# Patient Record
Sex: Female | Born: 1935 | Race: White | Hispanic: No | Smoking: Never smoker
Health system: Southern US, Community
[De-identification: ages and names within clinical notes are randomized; demographics above are authoritative.]

## PROBLEM LIST (undated history)

## (undated) DIAGNOSIS — I10 Essential (primary) hypertension: Secondary | ICD-10-CM

## (undated) DIAGNOSIS — G459 Transient cerebral ischemic attack, unspecified: Secondary | ICD-10-CM

## (undated) DIAGNOSIS — H409 Unspecified glaucoma: Secondary | ICD-10-CM

## (undated) HISTORY — PX: OTHER SURGICAL HISTORY: SHX169

---

## 2001-12-02 ENCOUNTER — Ambulatory Visit (HOSPITAL_COMMUNITY): Admission: RE | Admit: 2001-12-02 | Discharge: 2001-12-02 | Payer: Self-pay | Admitting: Otolaryngology

## 2001-12-02 ENCOUNTER — Encounter: Payer: Self-pay | Admitting: Otolaryngology

## 2007-07-22 ENCOUNTER — Ambulatory Visit (HOSPITAL_COMMUNITY): Admission: RE | Admit: 2007-07-22 | Discharge: 2007-07-22 | Payer: Self-pay | Admitting: Obstetrics and Gynecology

## 2010-06-02 ENCOUNTER — Other Ambulatory Visit (HOSPITAL_COMMUNITY): Payer: Self-pay | Admitting: Pulmonary Disease

## 2010-06-07 ENCOUNTER — Ambulatory Visit (HOSPITAL_COMMUNITY)
Admission: RE | Admit: 2010-06-07 | Discharge: 2010-06-07 | Disposition: A | Discharge status: Home / Self Care | Payer: Medicare Other | Source: Ambulatory Visit | Attending: Pulmonary Disease | Admitting: Pulmonary Disease

## 2010-06-07 DIAGNOSIS — M818 Other osteoporosis without current pathological fracture: Secondary | ICD-10-CM | POA: Insufficient documentation

## 2010-06-07 DIAGNOSIS — Z780 Asymptomatic menopausal state: Secondary | ICD-10-CM | POA: Insufficient documentation

## 2012-06-06 ENCOUNTER — Ambulatory Visit (INDEPENDENT_AMBULATORY_CARE_PROVIDER_SITE_OTHER): Payer: Medicare Other | Admitting: Otolaryngology

## 2012-06-06 DIAGNOSIS — H903 Sensorineural hearing loss, bilateral: Secondary | ICD-10-CM

## 2012-06-06 DIAGNOSIS — H6120 Impacted cerumen, unspecified ear: Secondary | ICD-10-CM

## 2012-10-07 ENCOUNTER — Observation Stay (HOSPITAL_COMMUNITY)
Admission: EM | Admit: 2012-10-07 | Discharge: 2012-10-08 | Disposition: A | Discharge status: Home / Self Care | Payer: Medicare Other | Attending: Pulmonary Disease | Admitting: Pulmonary Disease

## 2012-10-07 ENCOUNTER — Encounter (HOSPITAL_COMMUNITY): Payer: Self-pay | Admitting: Family Medicine

## 2012-10-07 ENCOUNTER — Emergency Department (HOSPITAL_COMMUNITY): Payer: Medicare Other

## 2012-10-07 ENCOUNTER — Observation Stay (HOSPITAL_COMMUNITY): Payer: Medicare Other

## 2012-10-07 DIAGNOSIS — Z79899 Other long term (current) drug therapy: Secondary | ICD-10-CM | POA: Insufficient documentation

## 2012-10-07 DIAGNOSIS — H539 Unspecified visual disturbance: Secondary | ICD-10-CM | POA: Insufficient documentation

## 2012-10-07 DIAGNOSIS — R51 Headache: Secondary | ICD-10-CM | POA: Insufficient documentation

## 2012-10-07 DIAGNOSIS — F411 Generalized anxiety disorder: Secondary | ICD-10-CM | POA: Insufficient documentation

## 2012-10-07 DIAGNOSIS — H409 Unspecified glaucoma: Secondary | ICD-10-CM | POA: Insufficient documentation

## 2012-10-07 DIAGNOSIS — R4789 Other speech disturbances: Secondary | ICD-10-CM | POA: Insufficient documentation

## 2012-10-07 DIAGNOSIS — R4702 Dysphasia: Secondary | ICD-10-CM

## 2012-10-07 DIAGNOSIS — Z66 Do not resuscitate: Secondary | ICD-10-CM | POA: Insufficient documentation

## 2012-10-07 DIAGNOSIS — G459 Transient cerebral ischemic attack, unspecified: Principal | ICD-10-CM

## 2012-10-07 DIAGNOSIS — R4182 Altered mental status, unspecified: Secondary | ICD-10-CM

## 2012-10-07 DIAGNOSIS — I10 Essential (primary) hypertension: Secondary | ICD-10-CM

## 2012-10-07 HISTORY — DX: Unspecified glaucoma: H40.9

## 2012-10-07 HISTORY — DX: Essential (primary) hypertension: I10

## 2012-10-07 LAB — URINALYSIS, ROUTINE W REFLEX MICROSCOPIC
Bilirubin Urine: NEGATIVE
Glucose, UA: NEGATIVE mg/dL
Hgb urine dipstick: NEGATIVE
Ketones, ur: NEGATIVE mg/dL
Leukocytes, UA: NEGATIVE
Nitrite: NEGATIVE
Protein, ur: NEGATIVE mg/dL
Specific Gravity, Urine: 1.02 (ref 1.005–1.030)
Urobilinogen, UA: 0.2 mg/dL (ref 0.0–1.0)
pH: 5.5 (ref 5.0–8.0)

## 2012-10-07 LAB — CBC WITH DIFFERENTIAL/PLATELET
Basophils Absolute: 0 10*3/uL (ref 0.0–0.1)
Basophils Relative: 1 % (ref 0–1)
HCT: 40.2 % (ref 36.0–46.0)
Hemoglobin: 13.6 g/dL (ref 12.0–15.0)
MCHC: 33.8 g/dL (ref 30.0–36.0)
MCV: 94.8 fL (ref 78.0–100.0)
Neutro Abs: 3.6 10*3/uL (ref 1.7–7.7)
Neutrophils Relative %: 61 % (ref 43–77)
Platelets: 220 10*3/uL (ref 150–400)
RDW: 12.8 % (ref 11.5–15.5)
WBC: 5.9 10*3/uL (ref 4.0–10.5)

## 2012-10-07 LAB — COMPREHENSIVE METABOLIC PANEL
AST: 17 U/L (ref 0–37)
Albumin: 3.7 g/dL (ref 3.5–5.2)
Chloride: 102 mEq/L (ref 96–112)
Creatinine, Ser: 0.96 mg/dL (ref 0.50–1.10)
GFR calc Af Amer: 60 mL/min — ABNORMAL LOW (ref >90)
GFR calc non Af Amer: 52 mL/min — ABNORMAL LOW (ref >90)
Glucose, Bld: 108 mg/dL — ABNORMAL HIGH (ref 70–99)
Potassium: 3.6 mEq/L (ref 3.5–5.1)
Sodium: 139 mEq/L (ref 135–145)
Total Bilirubin: 0.4 mg/dL (ref 0.3–1.2)

## 2012-10-07 LAB — TROPONIN I: Troponin I: <0.3 ng/mL (ref <0.30)

## 2012-10-07 MED ORDER — BRIMONIDINE TARTRATE-TIMOLOL 0.2-0.5 % OP SOLN: 1.0000 [drp] | Freq: Two times a day (BID) | OPHTHALMIC | Status: DC

## 2012-10-07 MED ORDER — BRIMONIDINE TARTRATE 0.2 % OP SOLN
1.0000 [drp] | Freq: Two times a day (BID) | OPHTHALMIC | Status: DC
  Filled 2012-10-07: qty 5

## 2012-10-07 MED ORDER — ASPIRIN 325 MG PO TABS
325.0000 mg | ORAL_TABLET | Freq: Every day | ORAL | Status: DC
  Administered 2012-10-08: 325 mg via ORAL
  Filled 2012-10-07: qty 1

## 2012-10-07 MED ORDER — PANTOPRAZOLE SODIUM 40 MG PO TBEC: 40.0000 mg | DELAYED_RELEASE_TABLET | Freq: Every day | ORAL | Status: DC | PRN

## 2012-10-07 MED ORDER — ACETAMINOPHEN 325 MG PO TABS
650.0000 mg | ORAL_TABLET | ORAL | Status: DC | PRN
  Administered 2012-10-07 – 2012-10-08 (×2): 650 mg via ORAL
  Filled 2012-10-07 (×2): qty 2

## 2012-10-07 MED ORDER — LATANOPROST 0.005 % OP SOLN
1.0000 [drp] | Freq: Every day | OPHTHALMIC | Status: DC
  Administered 2012-10-07: 1 [drp] via OPHTHALMIC
  Filled 2012-10-07: qty 2.5

## 2012-10-07 MED ORDER — LORAZEPAM 0.5 MG PO TABS: 0.5000 mg | ORAL_TABLET | Freq: Two times a day (BID) | ORAL | Status: DC | PRN

## 2012-10-07 MED ORDER — TIMOLOL MALEATE 0.5 % OP SOLN: 1.0000 [drp] | Freq: Two times a day (BID) | OPHTHALMIC | Status: DC

## 2012-10-07 MED ORDER — LATANOPROST 0.005 % OP SOLN
OPHTHALMIC | Status: AC
  Filled 2012-10-07: qty 2.5

## 2012-10-07 MED ORDER — ENOXAPARIN SODIUM 40 MG/0.4ML ~~LOC~~ SOLN
40.0000 mg | SUBCUTANEOUS | Status: DC
  Administered 2012-10-07: 40 mg via SUBCUTANEOUS
  Filled 2012-10-07: qty 0.4

## 2012-10-07 MED ORDER — ACETAMINOPHEN 325 MG PO TABS
650.0000 mg | ORAL_TABLET | Freq: Four times a day (QID) | ORAL | Status: DC | PRN
  Administered 2012-10-07: 650 mg via ORAL
  Filled 2012-10-07: qty 2

## 2012-10-07 NOTE — ED Provider Notes (Signed)
CSN: [id number]     Arrival date & time 10/07/12  1426 History   First MD Initiated Contact with Patient 10/07/12 1501     Chief Complaint  Patient presents with  . Altered Mental Status   level V caveat due to altered mental status (Consider location/radiation/quality/duration/timing/severity/associated sxs/prior Treatment) Patient is a 79 y.o. female presenting with altered mental status. The history is provided by the patient and a relative. The history is limited by the condition of the patient.  Altered Mental Status  patient presents with confusion. Reportedly had left a normal message for daughter last night. This morning she has been confused. She states she was trying to find her father. She states she has a little bit of headache. Per daughter the patient has been improving somewhat since earlier today. Patient states she sometimes gets headaches and that her vision changes. No past medical history on file. No past surgical history on file. No family history on file. History  Substance Use Topics  . Smoking status: Not on file  . Smokeless tobacco: Not on file  . Alcohol Use: Not on file   OB History   No data available     Review of Systems  Unable to perform ROS: Mental status change    Allergies  Review of patient's allergies indicates no known allergies.  Home Medications   Current Outpatient Rx  Name  Route  Sig  Dispense  Refill  . acetaminophen (TYLENOL) 500 MG tablet   Oral   Take 500 mg by mouth every 6 (six) hours as needed for pain.         Marland Kitchen amlodipine-olmesartan (AZOR) 10-20 MG per tablet   Oral   Take 1 tablet by mouth daily.         . COMBIGAN 0.2-0.5 % ophthalmic solution   Ophthalmic   Apply 1 drop to eye 2 (two) times daily.          Marland Kitchen LORazepam (ATIVAN) 0.5 MG tablet   Oral   Take 0.5 mg by mouth 2 (two) times daily as needed.          Marland Kitchen LUMIGAN 0.01 % SOLN   Ophthalmic   Apply 1 drop to eye at bedtime.          .  pantoprazole (PROTONIX) 40 MG tablet   Oral   Take 40 mg by mouth daily as needed (for heartburn/acid reflux). Prescribed one tablet twice daily         . Phenazopyridine HCl (AZO STANDARD MAXIMUM STRENGTH) 97.5 MG TABS   Oral   Take 1-2 tablets by mouth daily as needed (for prevention of UTI).          BP 153/80  Pulse 83  Temp(Src) 98.1 F (36.7 C) (Oral)  SpO2 99% Physical Exam  Constitutional: She appears well-developed and well-nourished.  HENT:  Head: Normocephalic.  Eyes: Pupils are equal, round, and reactive to light.  Neck: Neck supple.  Cardiovascular: Normal rate and regular rhythm.   Pulmonary/Chest: Effort normal and breath sounds normal.  Abdominal: Soft. There is no tenderness.  Musculoskeletal: Normal range of motion.  Neurological: She is alert.  Mild confusion. Was all extremities symmetrically. Face is symmetric.  Skin: Skin is warm.    ED Course  Procedures (including critical care time) Labs Review Labs Reviewed  COMPREHENSIVE METABOLIC PANEL - Abnormal; Notable for the following:    Glucose, Bld 108 (*)    GFR calc non Af Amer 52 (*)  GFR calc Af Amer 60 (*)    All other components within normal limits  CBC WITH DIFFERENTIAL  URINALYSIS, ROUTINE W REFLEX MICROSCOPIC  TROPONIN I   Imaging Review Dg Chest 2 View  10/07/2012   CLINICAL DATA:  Altered mental status. No chest complaints.  EXAM: CHEST  2 VIEW  COMPARISON:  None.  FINDINGS: The cardiopericardial silhouette is upper limits of normal for projection. There is abnormal morphology of right infrahilar lung, with findings suggesting bronchiectasis. A right hilar mass cannot be excluded and a followup chest CT, preferably with infusion is recommended. This can be performed on a nonemergent basis. There is no airspace disease or pleural effusion. Aortic atherosclerosis. Bilateral pleural apical scarring is present. Age indeterminate mid thoracic compression fractures are present. Dextro convex  lower thoracic scoliosis.  IMPRESSION: 1. No acute cardiopulmonary disease. 2. Fullness of the right hilum and infrahilar region. This may represent chronic bronchitic change however right hilar mass is not possible to exclude and followup chest CT, preferably within effusion is recommended. This can be performed on a nonemergent basis.   Electronically Signed   By: Andreas Newport M.D.   On: 10/07/2012 15:29   Ct Head Wo Contrast  10/07/2012   CLINICAL DATA:  79 year old female with altered mental status. Confusion. Intermittent speech difficulty. Last seem normal on 10/06/2012.  EXAM: CT HEAD WITHOUT CONTRAST  TECHNIQUE: Contiguous axial images were obtained from the base of the skull through the vertex without intravenous contrast.  COMPARISON:  None.  FINDINGS: Visualized paranasal sinuses and mastoids are clear. No acute osseous abnormality identified. Visualized orbits and scalp soft tissues are within normal limits.  Mild Calcified atherosclerosis at the skull base. Cerebral volume is within normal limits for age. No ventriculomegaly. No midline shift, mass effect, or evidence of intracranial mass lesion. No acute intracranial hemorrhage identified. No evidence of cortically based acute infarction identified. Normal for age gray-white matter differentiation. No suspicious intracranial vascular hyperdensity.  IMPRESSION: Normal for age noncontrast CT appearance of the brain.   Electronically Signed   By: Augusto Gamble M.D.   On: 10/07/2012 15:35    Date: 10/07/2012  Rate: 78  Rhythm: normal sinus rhythm  QRS Axis: normal  Intervals: normal  ST/T Wave abnormalities: normal  Conduction Disutrbances:none  Narrative Interpretation:   Old EKG Reviewed: none available   MDM   1. Altered mental status    Patient with altered mental status. Have been having some difficulty speaking. Reportedly had some wrong words coming out. She also was talking about her grandfather and father, both of whom are  deceased. Lab work is overall reassuring. No clear signs of infection. Head CT is reassuring. She does have a dull headache. She'll be admitted to internal medicine for further evaluation treatment. Chest x-ray shows a fullness that may need a CT scan.    Tracey Walsh. Rubin Payor, MD 10/07/12 1655

## 2012-10-07 NOTE — ED Notes (Signed)
Report called to Jessica, RN on unit 300 

## 2012-10-07 NOTE — H&P (Signed)
History and Physical  Tracey Walsh WJX:[id number] DOB: 1924-12-27 DOA: 10/07/2012  Referring physician: Benjiman Core, MD PCP: Fredirick Maudlin, MD   Chief Complaint: Confusion  HPI:  79 year old woman presented to the emergency department with history of confusion, dysphasia and mild headache. Initial evaluation was unremarkable, patient was referred for TIA evaluation.  History obtained from patient as well as her daughter at bedside. Patient has been feeling fine lately including yesterday. She has a history of hypertension and is otherwise healthy. For many years now she has intermittent headaches with visual phenomenon the last 30 minutes to an hour. She had headache this morning which she characterizes as her usual headache and some visual disturbance with that. Headache is mild in nature. She did not take Tylenol but did take an anxiety medication. She still has some headache now but no visual disturbance. Her daughter noticed today that her mother seemed to be confused and having difficulty speaking. No focal motor weakness was noted. Patient denies any weakness. Currently both patient and daughter agreed that patient is at baseline without confusion or difficulty speaking.  Emergency department noted to be afebrile with stable vital signs. Complete metabolic panel, troponin, CBC, urinalysis unremarkable. CT of the head normal. Chest x-ray nonacute. EKG not acute.  Review of Systems:  Negative for fever, sore throat, rash, new muscle aches, chest pain, SOB, dysuria, bleeding, n/v/abdominal pain.  Past Medical History  Diagnosis Date  . HTN (hypertension)   . Glaucoma     Past Surgical History  Procedure Laterality Date  . None      Social History:  reports that she has never smoked. She does not have any smokeless tobacco history on file. She reports that she does not drink alcohol or use illicit drugs.  No Known Allergies  Family History  Problem Relation Age of Onset   . Hypertension Father   . Diabetes Mother      Prior to Admission medications   Medication Sig Start Date End Date Taking? Authorizing Provider  acetaminophen (TYLENOL) 500 MG tablet Take 500 mg by mouth every 6 (six) hours as needed for pain.   Yes Historical Provider, MD  amlodipine-olmesartan (AZOR) 10-20 MG per tablet Take 1 tablet by mouth daily.   Yes Historical Provider, MD  COMBIGAN 0.2-0.5 % ophthalmic solution Apply 1 drop to eye 2 (two) times daily.  07/31/12  Yes Historical Provider, MD  LORazepam (ATIVAN) 0.5 MG tablet Take 0.5 mg by mouth 2 (two) times daily as needed.  10/02/12  Yes Historical Provider, MD  LUMIGAN 0.01 % SOLN Apply 1 drop to eye at bedtime.  10/02/12  Yes Historical Provider, MD  pantoprazole (PROTONIX) 40 MG tablet Take 40 mg by mouth daily as needed (for heartburn/acid reflux). Prescribed one tablet twice daily 07/31/12  Yes Historical Provider, MD  Phenazopyridine HCl (AZO STANDARD MAXIMUM STRENGTH) 97.5 MG TABS Take 1-2 tablets by mouth daily as needed (for prevention of UTI).   Yes Historical Provider, MD   Physical Exam: Filed Vitals:   10/07/12 1456 10/07/12 1500 10/07/12 1735 10/07/12 1757  BP: 169/86 153/80 151/70   Pulse:  83 85   Temp: 98.1 F (36.7 C)   98.4 F (36.9 C)  TempSrc: Oral     Resp:   16   SpO2: 99%  97%     General: Examined in the emergency department.  Appears calm and comfortable Eyes: PERRL, normal lids, irises ENT: Hard of hearing. Grossly normal lips & tongue Neck: no LAD,  masses or thyromegaly Cardiovascular: RRR, no m/r/g. No LE edema. Respiratory: CTA bilaterally, no w/r/r. Normal respiratory effort. Abdomen: soft, ntnd Skin: no rash or induration seen  Musculoskeletal: grossly normal tone BUE/BLE. Strength 5/5 upper and lower extremities, symmetric. Psychiatric: grossly normal mood and affect, speech fluent and appropriate Neurologic: Cranial nerves 2-12 intact. No pronator drift. No dysdiadochokinesis.  Wt  Readings from Last 3 Encounters:  No data found for Wt    Labs on Admission:  Basic Metabolic Panel:  Recent Labs Lab 10/07/12 1537  NA 139  K 3.6  CL 102  CO2 27  GLUCOSE 108*  BUN 15  CREATININE 0.96  CALCIUM 9.8    Liver Function Tests:  Recent Labs Lab 10/07/12 1537  AST 17  ALT 12  ALKPHOS 55  BILITOT 0.4  PROT 6.6  ALBUMIN 3.7   CBC:  Recent Labs Lab 10/07/12 1537  WBC 5.9  NEUTROABS 3.6  HGB 13.6  HCT 40.2  MCV 94.8  PLT 220    Cardiac Enzymes:  Recent Labs Lab 10/07/12 1537  TROPONINI <0.30    Radiological Exams on Admission: Dg Chest 2 View  10/07/2012   CLINICAL DATA:  Altered mental status. No chest complaints.  EXAM: CHEST  2 VIEW  COMPARISON:  None.  FINDINGS: The cardiopericardial silhouette is upper limits of normal for projection. There is abnormal morphology of right infrahilar lung, with findings suggesting bronchiectasis. A right hilar mass cannot be excluded and a followup chest CT, preferably with infusion is recommended. This can be performed on a nonemergent basis. There is no airspace disease or pleural effusion. Aortic atherosclerosis. Bilateral pleural apical scarring is present. Age indeterminate mid thoracic compression fractures are present. Dextro convex lower thoracic scoliosis.  IMPRESSION: 1. No acute cardiopulmonary disease. 2. Fullness of the right hilum and infrahilar region. This may represent chronic bronchitic change however right hilar mass is not possible to exclude and followup chest CT, preferably within effusion is recommended. This can be performed on a nonemergent basis.   Electronically Signed   By: Andreas Newport M.D.   On: 10/07/2012 15:29   Ct Head Wo Contrast  10/07/2012   CLINICAL DATA:  79 year old female with altered mental status. Confusion. Intermittent speech difficulty. Last seem normal on 10/06/2012.  EXAM: CT HEAD WITHOUT CONTRAST  TECHNIQUE: Contiguous axial images were obtained from the base of  the skull through the vertex without intravenous contrast.  COMPARISON:  None.  FINDINGS: Visualized paranasal sinuses and mastoids are clear. No acute osseous abnormality identified. Visualized orbits and scalp soft tissues are within normal limits.  Mild Calcified atherosclerosis at the skull base. Cerebral volume is within normal limits for age. No ventriculomegaly. No midline shift, mass effect, or evidence of intracranial mass lesion. No acute intracranial hemorrhage identified. No evidence of cortically based acute infarction identified. Normal for age gray-white matter differentiation. No suspicious intracranial vascular hyperdensity.  IMPRESSION: Normal for age noncontrast CT appearance of the brain.   Electronically Signed   By: Augusto Gamble M.D.   On: 10/07/2012 15:35    EKG: Independently reviewed. Normal sinus rhythm. No acute changes.   Principal Problem:   TIA (transient ischemic attack) Active Problems:   Dysphasia   HTN (hypertension)   Assessment/Plan 1. Possible dysphasia, possible encephalopathy: No evidence on examination/admission. Differential includes TIA, small stroke. Favor headache phenomenon. Observation, TIA evaluation. 2. Possible TIA: Evaluation as above. No focal deficits currently. 3. Headache: Acute on chronic. No signs or symptoms to suggest worrisome etiology.  2 Tylenol. 4. Fullness of the right hilum and infrahilar region : Etiology unclear. Further evaluation can be deferred for now to the outpatient setting. 5. Hypertension 6. Glaucoma  Code Status: DNR/DNI  DVT prophylaxis:Lovenox Family Communication: discussed with daughter at bedside Disposition Plan/Anticipated LOS: observation, 1-2 days  Time spent: 50 minutes  Brendia Sacks, MD  [facility] Pager [phone] 10/07/2012, 6:22 PM

## 2012-10-07 NOTE — ED Notes (Signed)
Per EMS, pt last seen normal last night. Per EMS, pt woke up this am and has been intermittently altered and confused. Pt speech impaired with intermittent expressive aphasia. Pt alert. Airway patent.

## 2012-10-08 ENCOUNTER — Observation Stay (HOSPITAL_COMMUNITY): Payer: Medicare Other

## 2012-10-08 ENCOUNTER — Observation Stay (HOSPITAL_COMMUNITY)
Admit: 2012-10-08 | Discharge: 2012-10-08 | Disposition: A | Discharge status: Home / Self Care | Payer: Medicare Other | Attending: Neurology | Admitting: Neurology

## 2012-10-08 DIAGNOSIS — I369 Nonrheumatic tricuspid valve disorder, unspecified: Secondary | ICD-10-CM

## 2012-10-08 LAB — LIPID PANEL
Cholesterol: 164 mg/dL (ref 0–200)
Total CHOL/HDL Ratio: 2.1 RATIO
VLDL: 11 mg/dL (ref 0–40)

## 2012-10-08 LAB — HEMOGLOBIN A1C: Mean Plasma Glucose: 128 mg/dL — ABNORMAL HIGH (ref <117)

## 2012-10-08 LAB — VITAMIN B12: Vitamin B-12: 331 pg/mL (ref 211–911)

## 2012-10-08 MED ORDER — CLOPIDOGREL BISULFATE 75 MG PO TABS: 75.0000 mg | ORAL_TABLET | Freq: Every day | ORAL | Status: AC

## 2012-10-08 NOTE — Progress Notes (Signed)
Subjective: She feels better. She has no new complaints. Her MRI did not show anything acute  Objective: Vital signs in last 24 hours: Temp:  [97.4 F (36.3 C)-98.4 F (36.9 C)] 97.4 F (36.3 C) (09/22 2232) Pulse Rate:  [83-85] 85 (09/22 2232) Resp:  [16-18] 18 (09/22 2232) BP: (148-169)/(70-86) 164/70 mmHg (09/22 2232) SpO2:  [97 %-99 %] 98 % (09/22 2232) Weight change:     Intake/Output from previous day:    PHYSICAL EXAM General appearance: alert, cooperative and no distress Resp: clear to auscultation bilaterally Cardio: regular rate and rhythm, S1, S2 normal, no murmur, click, rub or gallop GI: soft, non-tender; bowel sounds normal; no masses,  no organomegaly Extremities: extremities normal, atraumatic, no cyanosis or edema  Lab Results:    Basic Metabolic Panel:  Recent Labs  78/29/56 1537  NA 139  K 3.6  CL 102  CO2 27  GLUCOSE 108*  BUN 15  CREATININE 0.96  CALCIUM 9.8   Liver Function Tests:  Recent Labs  10/07/12 1537  AST 17  ALT 12  ALKPHOS 55  BILITOT 0.4  PROT 6.6  ALBUMIN 3.7   No results found for this basename: LIPASE, AMYLASE,  in the last 72 hours No results found for this basename: AMMONIA,  in the last 72 hours CBC:  Recent Labs  10/07/12 1537  WBC 5.9  NEUTROABS 3.6  HGB 13.6  HCT 40.2  MCV 94.8  PLT 220   Cardiac Enzymes:  Recent Labs  10/07/12 1537  TROPONINI <0.30   BNP: No results found for this basename: PROBNP,  in the last 72 hours D-Dimer: No results found for this basename: DDIMER,  in the last 72 hours CBG: No results found for this basename: GLUCAP,  in the last 72 hours Hemoglobin A1C: No results found for this basename: HGBA1C,  in the last 72 hours Fasting Lipid Panel:  Recent Labs  10/08/12 0532  CHOL 164  HDL 79  LDLCALC 74  TRIG 54  CHOLHDL 2.1   Thyroid Function Tests: No results found for this basename: TSH, T4TOTAL, FREET4, T3FREE, THYROIDAB,  in the last 72 hours Anemia  Panel: No results found for this basename: VITAMINB12, FOLATE, FERRITIN, TIBC, IRON, RETICCTPCT,  in the last 72 hours Coagulation: No results found for this basename: LABPROT, INR,  in the last 72 hours Urine Drug Screen: Drugs of Abuse  No results found for this basename: labopia, cocainscrnur, labbenz, amphetmu, thcu, labbarb    Alcohol Level: No results found for this basename: ETH,  in the last 72 hours Urinalysis:  Recent Labs  10/07/12 1510  COLORURINE YELLOW  LABSPEC 1.020  PHURINE 5.5  GLUCOSEU NEGATIVE  HGBUR NEGATIVE  BILIRUBINUR NEGATIVE  KETONESUR NEGATIVE  PROTEINUR NEGATIVE  UROBILINOGEN 0.2  NITRITE NEGATIVE  LEUKOCYTESUR NEGATIVE   Misc. Labs:  ABGS No results found for this basename: PHART, PCO2, PO2ART, TCO2, HCO3,  in the last 72 hours CULTURES No results found for this or any previous visit (from the past 240 hour(s)). Studies/Results: Dg Chest 2 View  10/07/2012   CLINICAL DATA:  Altered mental status. No chest complaints.  EXAM: CHEST  2 VIEW  COMPARISON:  None.  FINDINGS: The cardiopericardial silhouette is upper limits of normal for projection. There is abnormal morphology of right infrahilar lung, with findings suggesting bronchiectasis. A right hilar mass cannot be excluded and a followup chest CT, preferably with infusion is recommended. This can be performed on a nonemergent basis. There is no airspace disease  or pleural effusion. Aortic atherosclerosis. Bilateral pleural apical scarring is present. Age indeterminate mid thoracic compression fractures are present. Dextro convex lower thoracic scoliosis.  IMPRESSION: 1. No acute cardiopulmonary disease. 2. Fullness of the right hilum and infrahilar region. This may represent chronic bronchitic change however right hilar mass is not possible to exclude and followup chest CT, preferably within effusion is recommended. This can be performed on a nonemergent basis.   Electronically Signed   By: Andreas Newport M.D.   On: 10/07/2012 15:29   Ct Head Wo Contrast  10/07/2012   CLINICAL DATA:  79 year old female with altered mental status. Confusion. Intermittent speech difficulty. Last seem normal on 10/06/2012.  EXAM: CT HEAD WITHOUT CONTRAST  TECHNIQUE: Contiguous axial images were obtained from the base of the skull through the vertex without intravenous contrast.  COMPARISON:  None.  FINDINGS: Visualized paranasal sinuses and mastoids are clear. No acute osseous abnormality identified. Visualized orbits and scalp soft tissues are within normal limits.  Mild Calcified atherosclerosis at the skull base. Cerebral volume is within normal limits for age. No ventriculomegaly. No midline shift, mass effect, or evidence of intracranial mass lesion. No acute intracranial hemorrhage identified. No evidence of cortically based acute infarction identified. Normal for age gray-white matter differentiation. No suspicious intracranial vascular hyperdensity.  IMPRESSION: Normal for age noncontrast CT appearance of the brain.   Electronically Signed   By: Augusto Gamble M.D.   On: 10/07/2012 15:35   Mri Brain Without Contrast  10/08/2012   CLINICAL DATA:  Confusion and weakness. Trouble speaking. Off balance.  EXAM: MRI HEAD WITHOUT CONTRAST  MRA HEAD WITHOUT CONTRAST  TECHNIQUE: Multiplanar, multiecho pulse sequences of the brain and surrounding structures were obtained without intravenous contrast. Angiographic images of the head were obtained using MRA technique without contrast.  COMPARISON:  Head CT from the same day  FINDINGS: MRI HEAD FINDINGS  Calvarium and upper cervical spine: No marrow signal abnormality.  Orbits: No significant findings.  Sinuses: Clear. Mastoid and middle ears are clear.  Brain: No acute abnormality such as acute infarct, hemorrhage, hydrocephalus, or mass lesion. No evidence of large vessel occlusion.  The CSF around the cerebellar hemispheres is at out of proportion to foliar enlargement, compatible  with hygromas. These do not efface the 4th ventricle or significantly compress the cerebellum.  Patchy bilateral cerebral white matter T2 and FLAIR hyperintensity, consistent with chronic small vessel ischemia.  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  MRA HEAD FINDINGS  No acute abnormality such is large vessel occlusion.  No aneurysm.  There are multiple luminal irregularities of the intracranial vessels, predominant distal, consistent with atherosclerosis. The most proximal stenosis is a moderate narrowing of the distal right A1 segment. Notable left M3 segment stenoses to the upper division. There are moderate bilateral pericallosal stenoses additionally.  Posterior circulation shows left dominance. The proximal V3 segments show poor signal related to trajectory of the vessels. .  IMPRESSION: MRI HEAD IMPRESSION  1. Negative for acute intracranial abnormality. 2. Senescent changes including atrophy and chronic small vessel ischemia. 3. 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  MRA HEAD IMPRESSION  1. No acute arterial abnormality. 2. Intracranial atherosclerosis as above. The most proximal stenosis is a moderate narrowing of the distal right A1 segment.   Electronically Signed   By: Tiburcio Pea   On: 10/08/2012 04:57   Mr Maxine Glenn Head/brain Wo Cm  10/08/2012   CLINICAL DATA:  Confusion and weakness. Trouble speaking.  Off balance.  EXAM: MRI HEAD WITHOUT CONTRAST  MRA HEAD WITHOUT CONTRAST  TECHNIQUE: Multiplanar, multiecho pulse sequences of the brain and surrounding structures were obtained without intravenous contrast. Angiographic images of the head were obtained using MRA technique without contrast.  COMPARISON:  Head CT from the same day  FINDINGS: MRI HEAD FINDINGS  Calvarium and upper cervical spine: No marrow signal abnormality.  Orbits: No significant findings.  Sinuses: Clear. Mastoid and middle ears are clear.  Brain: No acute abnormality such as acute infarct,  hemorrhage, hydrocephalus, or mass lesion. No evidence of large vessel occlusion.  The CSF around the cerebellar hemispheres is at out of proportion to foliar enlargement, compatible with hygromas. These do not efface the 4th ventricle or significantly compress the cerebellum.  Patchy bilateral cerebral white matter T2 and FLAIR hyperintensity, consistent with chronic small vessel ischemia.  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  MRA HEAD FINDINGS  No acute abnormality such is large vessel occlusion.  No aneurysm.  There are multiple luminal irregularities of the intracranial vessels, predominant distal, consistent with atherosclerosis. The most proximal stenosis is a moderate narrowing of the distal right A1 segment. Notable left M3 segment stenoses to the upper division. There are moderate bilateral pericallosal stenoses additionally.  Posterior circulation shows left dominance. The proximal V3 segments show poor signal related to trajectory of the vessels. .  IMPRESSION: MRI HEAD IMPRESSION  1. Negative for acute intracranial abnormality. 2. Senescent changes including atrophy and chronic small vessel ischemia. 3. 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  MRA HEAD IMPRESSION  1. No acute arterial abnormality. 2. Intracranial atherosclerosis as above. The most proximal stenosis is a moderate narrowing of the distal right A1 segment.   Electronically Signed   By: Tiburcio Pea   On: 10/08/2012 04:57    Medications:  Prior to Admission:  Prescriptions prior to admission  Medication Sig Dispense Refill  . acetaminophen (TYLENOL) 500 MG tablet Take 500 mg by mouth every 6 (six) hours as needed for pain.      Marland Kitchen amlodipine-olmesartan (AZOR) 10-20 MG per tablet Take 1 tablet by mouth daily.      . COMBIGAN 0.2-0.5 % ophthalmic solution Apply 1 drop to eye 2 (two) times daily.       Marland Kitchen LORazepam (ATIVAN) 0.5 MG tablet Take 0.5 mg by mouth 2 (two) times daily as needed.       Marland Kitchen  LUMIGAN 0.01 % SOLN Apply 1 drop to eye at bedtime.       . pantoprazole (PROTONIX) 40 MG tablet Take 40 mg by mouth daily as needed (for heartburn/acid reflux). Prescribed one tablet twice daily      . Phenazopyridine HCl (AZO STANDARD MAXIMUM STRENGTH) 97.5 MG TABS Take 1-2 tablets by mouth daily as needed (for prevention of UTI).       Scheduled: . aspirin  325 mg Oral Daily  . brimonidine  1 drop Both Eyes BID   And  . timolol  1 drop Both Eyes BID  . enoxaparin (LOVENOX) injection  40 mg Subcutaneous Q24H  . latanoprost  1 drop Both Eyes QHS   Continuous:  [id number], LORazepam, pantoprazole  Assesment: She had what was probably a TIA although it may be related more to headache. She is back to baseline now. She has hypertension which is stable. Principal Problem:   TIA (transient ischemic attack) Active Problems:   Dysphasia   HTN (hypertension)    Plan: I'm going to  ask for neurology consultation. If she does not need any further workup in the hospital I will discharge her home    LOS: 1 day   Andi Layfield L 10/08/2012, 9:05 AM

## 2012-10-08 NOTE — Progress Notes (Signed)
Discharge notes discussed with patient and her daughter. IV removed, skin was clean, dry and intact. Patient discharged in no acute distress.

## 2012-10-08 NOTE — Consult Note (Signed)
[facility] Tracey Walsh A. Gerilyn Pilgrim, MD     [email or web address]          Tracey Walsh is an 79 y.o. female.   ASSESSMENT/PLAN: 1. Recurrent episodes of altered metastatic some blurry vision and headaches. Etiology includes the following: TIA, seizures/complex partial seizures and migraines. Because of the patient's age and vasculitis is also a consideration. The patient will be set up to have a sedimentation rate, thyroid function test and vitamin B12 levels. A carotid duplex Doppler will also be obtained. EEG is also suggested.  The patient is an 79 year old white female who presents with the acute onset of slurred speech, headache and visual obscuration. She reports what appears to be binocular visual obscuration. The spell lasted for about 30 minutes. The patient tells me however that she has had episodes of binocular visual obscuration 4 years. He is unclear what etiology is. She does not reports other symptoms such as headaches, focal numbness or weakness. There is no chest pain, shortness of breath, GI or GU symptoms. The review of systems is otherwise negative.  GENERAL: This is a very pleasant lady in no acute distress.  HEENT: Neck is supple and head is normocephalic atraumatic.   ABDOMEN: soft  EXTREMITIES: No edema   BACK: Unremarkable.  SKIN: Normal by inspection.    MENTAL STATUS: Alert and oriented. Speech, language and cognition are generally intact. Judgment and insight normal.   CRANIAL NERVES: Pupils are equal, round and reactive to light and accommodation; extra ocular movements are full, there is no significant nystagmus; visual fields are full; upper and lower facial muscles are normal in strength and symmetric, there is no flattening of the nasolabial folds; tongue is midline; uvula is midline; shoulder elevation is normal.  MOTOR: Normal tone, bulk and strength; no pronator drift.  COORDINATION: Left finger to nose is normal, right finger to nose is  normal, No rest tremor; no intention tremor; no postural tremor; no bradykinesia.  REFLEXES: Deep tendon reflexes are symmetrical and normal. Plantar responses are flexor bilaterally.   SENSATION: Normal to light touch and temperature.  GAIT: Normal.    Past Medical History  Diagnosis Date  . HTN (hypertension)   . Glaucoma     Past Surgical History  Procedure Laterality Date  . None      Family History  Problem Relation Age of Onset  . Hypertension Father   . Diabetes Mother     Social History:  reports that she has never smoked. She does not have any smokeless tobacco history on file. She reports that she does not drink alcohol or use illicit drugs.  Allergies: No Known Allergies  Medications: Prior to Admission medications   Medication Sig Start Date End Date Taking? Authorizing Provider  acetaminophen (TYLENOL) 500 MG tablet Take 500 mg by mouth every 6 (six) hours as needed for pain.   Yes Historical Provider, MD  amlodipine-olmesartan (AZOR) 10-20 MG per tablet Take 1 tablet by mouth daily.   Yes Historical Provider, MD  COMBIGAN 0.2-0.5 % ophthalmic solution Apply 1 drop to eye 2 (two) times daily.  07/31/12  Yes Historical Provider, MD  LORazepam (ATIVAN) 0.5 MG tablet Take 0.5 mg by mouth 2 (two) times daily as needed.  10/02/12  Yes Historical Provider, MD  LUMIGAN 0.01 % SOLN Apply 1 drop to eye at bedtime.  10/02/12  Yes Historical Provider, MD  pantoprazole (PROTONIX) 40 MG tablet Take 40 mg by mouth daily as needed (for heartburn/acid reflux). Prescribed one  tablet twice daily 07/31/12  Yes Historical Provider, MD  Phenazopyridine HCl (AZO STANDARD MAXIMUM STRENGTH) 97.5 MG TABS Take 1-2 tablets by mouth daily as needed (for prevention of UTI).   Yes Historical Provider, MD     Scheduled Meds: . aspirin  325 mg Oral Daily  . brimonidine  1 drop Both Eyes BID   And  . timolol  1 drop Both Eyes BID  . enoxaparin (LOVENOX) injection  40 mg Subcutaneous Q24H  .  latanoprost  1 drop Both Eyes QHS   Continuous Infusions:  PRN Meds:.acetaminophen, LORazepam, pantoprazole   Blood pressure 164/70, pulse 85, temperature 97.4 F (36.3 C), temperature source Oral, resp. rate 18, SpO2 98.00%.   Results for orders placed during the hospital encounter of 10/07/12 (from the past 48 hour(s))  URINALYSIS, ROUTINE W REFLEX MICROSCOPIC     Status: None   Collection Time    10/07/12  3:10 PM      Result Value Range   Color, Urine YELLOW  YELLOW   APPearance CLEAR  CLEAR   Specific Gravity, Urine 1.020  1.005 - 1.030   pH 5.5  5.0 - 8.0   Glucose, UA NEGATIVE  NEGATIVE mg/dL   Hgb urine dipstick NEGATIVE  NEGATIVE   Bilirubin Urine NEGATIVE  NEGATIVE   Ketones, ur NEGATIVE  NEGATIVE mg/dL   Protein, ur NEGATIVE  NEGATIVE mg/dL   Urobilinogen, UA 0.2  0.0 - 1.0 mg/dL   Nitrite NEGATIVE  NEGATIVE   Leukocytes, UA NEGATIVE  NEGATIVE   Comment: MICROSCOPIC NOT DONE ON URINES WITH NEGATIVE PROTEIN, BLOOD, LEUKOCYTES, NITRITE, OR GLUCOSE <1000 mg/dL.  CBC WITH DIFFERENTIAL     Status: None   Collection Time    10/07/12  3:37 PM      Result Value Range   WBC 5.9  4.0 - 10.5 K/uL   RBC 4.24  3.87 - 5.11 MIL/uL   Hemoglobin 13.6  12.0 - 15.0 g/dL   HCT 60.4  54.0 - 98.1 %   MCV 94.8  78.0 - 100.0 fL   MCH 32.1  26.0 - 34.0 pg   MCHC 33.8  30.0 - 36.0 g/dL   RDW 19.1  47.8 - 29.5 %   Platelets 220  150 - 400 K/uL   Neutrophils Relative % 61  43 - 77 %   Neutro Abs 3.6  1.7 - 7.7 K/uL   Lymphocytes Relative 29  12 - 46 %   Lymphs Abs 1.7  0.7 - 4.0 K/uL   Monocytes Relative 7  3 - 12 %   Monocytes Absolute 0.4  0.1 - 1.0 K/uL   Eosinophils Relative 2  0 - 5 %   Eosinophils Absolute 0.1  0.0 - 0.7 K/uL   Basophils Relative 1  0 - 1 %   Basophils Absolute 0.0  0.0 - 0.1 K/uL  COMPREHENSIVE METABOLIC PANEL     Status: Abnormal   Collection Time    10/07/12  3:37 PM      Result Value Range   Sodium 139  135 - 145 mEq/L   Potassium 3.6  3.5 - 5.1 mEq/L    Chloride 102  96 - 112 mEq/L   CO2 27  19 - 32 mEq/L   Glucose, Bld 108 (*) 70 - 99 mg/dL   BUN 15  6 - 23 mg/dL   Creatinine, Ser 6.21  0.50 - 1.10 mg/dL   Calcium 9.8  8.4 - 30.8 mg/dL   Total Protein 6.6  6.0 - 8.3 g/dL   Albumin 3.7  3.5 - 5.2 g/dL   AST 17  0 - 37 U/L   ALT 12  0 - 35 U/L   Alkaline Phosphatase 55  39 - 117 U/L   Total Bilirubin 0.4  0.3 - 1.2 mg/dL   GFR calc non Af Amer 52 (*) >90 mL/min   GFR calc Af Amer 60 (*) >90 mL/min   Comment: (NOTE)     The eGFR has been calculated using the CKD EPI equation.     This calculation has not been validated in all clinical situations.     eGFR's persistently <90 mL/min signify possible Chronic Kidney     Disease.  TROPONIN I     Status: None   Collection Time    10/07/12  3:37 PM      Result Value Range   Troponin I <0.30  <0.30 ng/mL   Comment:            Due to the release kinetics of cTnI,     a negative result within the first hours     of the onset of symptoms does not rule out     myocardial infarction with certainty.     If myocardial infarction is still suspected,     repeat the test at appropriate intervals.  LIPID PANEL     Status: None   Collection Time    10/08/12  5:32 AM      Result Value Range   Cholesterol 164  0 - 200 mg/dL   Triglycerides 54  <409 mg/dL   HDL 79  >81 mg/dL   Total CHOL/HDL Ratio 2.1     VLDL 11  0 - 40 mg/dL   LDL Cholesterol 74  0 - 99 mg/dL   Comment:            Total Cholesterol/HDL:CHD Risk     Coronary Heart Disease Risk Table                         Men   Women      1/2 Average Risk   3.4   3.3      Average Risk       5.0   4.4      2 X Average Risk   9.6   7.1      3 X Average Risk  23.4   11.0                Use the calculated Patient Ratio     above and the CHD Risk Table     to determine the patient's CHD Risk.                ATP III CLASSIFICATION (LDL):      <100     mg/dL   Optimal      191-478  mg/dL   Near or Above                        Optimal        130-159  mg/dL   Borderline      295-621  mg/dL   High      >308     mg/dL   Very High    Dg Chest 2 View  10/07/2012   CLINICAL DATA:  Altered mental status. No chest complaints.  EXAM: CHEST  2 VIEW  COMPARISON:  None.  FINDINGS: The cardiopericardial silhouette is upper limits of normal for projection. There is abnormal morphology of right infrahilar lung, with findings suggesting bronchiectasis. A right hilar mass cannot be excluded and a followup chest CT, preferably with infusion is recommended. This can be performed on a nonemergent basis. There is no airspace disease or pleural effusion. Aortic atherosclerosis. Bilateral pleural apical scarring is present. Age indeterminate mid thoracic compression fractures are present. Dextro convex lower thoracic scoliosis.  IMPRESSION: 1. No acute cardiopulmonary disease. 2. Fullness of the right hilum and infrahilar region. This may represent chronic bronchitic change however right hilar mass is not possible to exclude and followup chest CT, preferably within effusion is recommended. This can be performed on a nonemergent basis.   Electronically Signed   By: Andreas Newport M.D.   On: 10/07/2012 15:29   Ct Head Wo Contrast  10/07/2012   CLINICAL DATA:  79 year old female with altered mental status. Confusion. Intermittent speech difficulty. Last seem normal on 10/06/2012.  EXAM: CT HEAD WITHOUT CONTRAST  TECHNIQUE: Contiguous axial images were obtained from the base of the skull through the vertex without intravenous contrast.  COMPARISON:  None.  FINDINGS: Visualized paranasal sinuses and mastoids are clear. No acute osseous abnormality identified. Visualized orbits and scalp soft tissues are within normal limits.  Mild Calcified atherosclerosis at the skull base. Cerebral volume is within normal limits for age. No ventriculomegaly. No midline shift, mass effect, or evidence of intracranial mass lesion. No acute intracranial hemorrhage identified. No  evidence of cortically based acute infarction identified. Normal for age gray-white matter differentiation. No suspicious intracranial vascular hyperdensity.  IMPRESSION: Normal for age noncontrast CT appearance of the brain.   Electronically Signed   By: Augusto Gamble M.D.   On: 10/07/2012 15:35   Mri Brain Without Contrast  10/08/2012   CLINICAL DATA:  Confusion and weakness. Trouble speaking. Off balance.  EXAM: MRI HEAD WITHOUT CONTRAST  MRA HEAD WITHOUT CONTRAST  TECHNIQUE: Multiplanar, multiecho pulse sequences of the brain and surrounding structures were obtained without intravenous contrast. Angiographic images of the head were obtained using MRA technique without contrast.  COMPARISON:  Head CT from the same day  FINDINGS: MRI HEAD FINDINGS  Calvarium and upper cervical spine: No marrow signal abnormality.  Orbits: No significant findings.  Sinuses: Clear. Mastoid and middle ears are clear.  Brain: No acute abnormality such as acute infarct, hemorrhage, hydrocephalus, or mass lesion. No evidence of large vessel occlusion.  The CSF around the cerebellar hemispheres is at out of proportion to foliar enlargement, compatible with hygromas. These do not efface the 4th ventricle or significantly compress the cerebellum.  Patchy bilateral cerebral white matter T2 and FLAIR hyperintensity, consistent with chronic small vessel ischemia.  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  MRA HEAD FINDINGS  No acute abnormality such is large vessel occlusion.  No aneurysm.  There are multiple luminal irregularities of the intracranial vessels, predominant distal, consistent with atherosclerosis. The most proximal stenosis is a moderate narrowing of the distal right A1 segment. Notable left M3 segment stenoses to the upper division. There are moderate bilateral pericallosal stenoses additionally.  Posterior circulation shows left dominance. The proximal V3 segments show poor signal related to trajectory of the  vessels. .  IMPRESSION: MRI HEAD IMPRESSION  1. Negative for acute intracranial abnormality. 2. Senescent changes including atrophy and chronic small vessel ischemia. 3. 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  MRA HEAD IMPRESSION  1. No acute  arterial abnormality. 2. Intracranial atherosclerosis as above. The most proximal stenosis is a moderate narrowing of the distal right A1 segment.   Electronically Signed   By: Tiburcio Pea   On: 10/08/2012 04:57   Mr Maxine Glenn Head/brain Wo Cm  10/08/2012   CLINICAL DATA:  Confusion and weakness. Trouble speaking. Off balance.  EXAM: MRI HEAD WITHOUT CONTRAST  MRA HEAD WITHOUT CONTRAST  TECHNIQUE: Multiplanar, multiecho pulse sequences of the brain and surrounding structures were obtained without intravenous contrast. Angiographic images of the head were obtained using MRA technique without contrast.  COMPARISON:  Head CT from the same day  FINDINGS: MRI HEAD FINDINGS  Calvarium and upper cervical spine: No marrow signal abnormality.  Orbits: No significant findings.  Sinuses: Clear. Mastoid and middle ears are clear.  Brain: No acute abnormality such as acute infarct, hemorrhage, hydrocephalus, or mass lesion. No evidence of large vessel occlusion.  The CSF around the cerebellar hemispheres is at out of proportion to foliar enlargement, compatible with hygromas. These do not efface the 4th ventricle or significantly compress the cerebellum.  Patchy bilateral cerebral white matter T2 and FLAIR hyperintensity, consistent with chronic small vessel ischemia.  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  MRA HEAD FINDINGS  No acute abnormality such is large vessel occlusion.  No aneurysm.  There are multiple luminal irregularities of the intracranial vessels, predominant distal, consistent with atherosclerosis. The most proximal stenosis is a moderate narrowing of the distal right A1 segment. Notable left M3 segment stenoses to the upper division. There  are moderate bilateral pericallosal stenoses additionally.  Posterior circulation shows left dominance. The proximal V3 segments show poor signal related to trajectory of the vessels. .  IMPRESSION: MRI HEAD IMPRESSION  1. Negative for acute intracranial abnormality. 2. Senescent changes including atrophy and chronic small vessel ischemia. 3. 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  MRA HEAD IMPRESSION  1. No acute arterial abnormality. 2. Intracranial atherosclerosis as above. The most proximal stenosis is a moderate narrowing of the distal right A1 segment.   Electronically Signed   By: Tiburcio Pea   On: 10/08/2012 04:57        Ciro Tashiro A. Gerilyn Pilgrim, M.D.  Diplomate, Biomedical engineer of Psychiatry and Neurology ( Neurology). 10/08/2012, 8:47 AM

## 2012-10-08 NOTE — Evaluation (Addendum)
Occupational Therapy Evaluation Patient Details Name: Tracey Walsh MRN: [id number] DOB: 1924/05/07 Today's Date: 2012-11-07 Time: 4098-1191 OT Time Calculation (min): 17 min  OT Assessment / Plan / Recommendation History of present illness Pt is admitted with HA and confusion.  She lives with her elderly husband and is normally independent with a walker at home.   Clinical Impression   Patient is a 79 y/o female s/p TIA presenting to acute OT with all education complete. Patient is functioning at baseline and does not require additional OT services at this time.     OT Assessment  Patient does not need any further OT services    Follow Up Recommendations  No OT follow up    Barriers to Discharge  None    Equipment Recommendations  None recommended by OT          Precautions / Restrictions Precautions Precautions: None Restrictions Weight Bearing Restrictions: No   Pertinent Vitals/Pain No complaints.     ADL  Lower Body Dressing: Performed;Independent Where Assessed - Lower Body Dressing: Unsupported sitting      OT Goals(Current goals can be found in the care plan section)  1 time eval  Visit Information  Last OT Received On: 11-07-12 Assistance Needed: +1 PT/OT Co-Evaluation/Treatment: Yes History of Present Illness: Pt is admitted with HA and confusion.  She lives with her elderly husband and is normally independent with a walker at home.       Prior Functioning     Home Living Family/patient expects to be discharged to:: Private residence Living Arrangements: Spouse/significant other Type of Home: House Home Access: Ramped entrance Home Equipment: Environmental consultant - 2 wheels;Cane - single point;Bedside commode;Tub bench Prior Function Level of Independence: Independent with assistive device(s) Communication Communication: No difficulties Dominant Hand: Right         Vision/Perception Vision - History Baseline Vision: Wears glasses all the  time Patient Visual Report: No change from baseline   Cognition  Cognition Arousal/Alertness: Awake/alert Behavior During Therapy: WFL for tasks assessed/performed Overall Cognitive Status: Within Functional Limits for tasks assessed    Extremity/Trunk Assessment Upper Extremity Assessment Upper Extremity Assessment: Overall WFL for tasks assessed Lower Extremity Assessment Lower Extremity Assessment: Overall WFL for tasks assessed     Mobility Bed Mobility Bed Mobility: Supine to Sit Supine to Sit: 7: Independent;HOB flat Transfers Transfers: Sit to Stand;Stand to Sit Sit to Stand: 7: Independent;From bed;With upper extremity assist Stand to Sit: 7: Independent;With upper extremity assist;To chair/3-in-1        Balance Balance Balance Assessed:  (WNL by functional observation)   End of Session OT - End of Session Equipment Utilized During Treatment: Gait belt;Rolling walker Activity Tolerance: Patient tolerated treatment well    2012-11-07 1036  OT G-codes **NOT FOR INPATIENT CLASS**  Functional Assessment Tool Used observation  Functional Limitation Self care  Self Care Current Status (Y7829) [facility]  Self Care Goal Status (F6213) [facility]  Self Care Discharge Status [phone]) [facility]   Limmie Patricia, OTR/L,CBIS   Nov 07, 2012, 9:22 AM

## 2012-10-08 NOTE — Evaluation (Signed)
Physical Therapy Evaluation Patient Details Name: Tracey Walsh MRN: [id number] DOB: Nov 10, 1924 Today's Date: 10/08/2012 Time: 1914-7829 PT Time Calculation (min): 1399 min  PT Assessment / Plan / Recommendation History of Present Illness  Pt is admitted with HA and confusion.  She lives with her elderly husband and is normally independent with a walker at home.  Clinical Impression   Pt was seen for evaluation.  No functional problems were found.  She is alert,oriented and very cooperative.  She appears to be at her baseline.    PT Assessment  Patent does not need any further PT services    Follow Up Recommendations  No PT follow up    Does the patient have the potential to tolerate intense rehabilitation      Barriers to Discharge        Equipment Recommendations  None recommended by PT    Recommendations for Other Services     Frequency      Precautions / Restrictions Precautions Precautions: None Restrictions Weight Bearing Restrictions: No   Pertinent Vitals/Pain       Mobility  Bed Mobility Bed Mobility: Supine to Sit Supine to Sit: 7: Independent;HOB flat Transfers Sit to Stand: 7: Independent;From bed;With upper extremity assist Stand to Sit: 7: Independent;With upper extremity assist;To chair/3-in-1 Ambulation/Gait Ambulation/Gait Assistance: 6: Modified independent (Device/Increase time) Ambulation Distance (Feet): 150 Feet Assistive device: Rolling walker Gait Pattern: Within Functional Limits Gait velocity: WNL General Gait Details: pt uses a walker due to degenerated right knee Stairs: No Wheelchair Mobility Wheelchair Mobility: No    Exercises     PT Diagnosis:    PT Problem List:   PT Treatment Interventions:       PT Goals(Current goals can be found in the care plan section) Acute Rehab PT Goals PT Goal Formulation: No goals set, d/c therapy  Visit Information  Last PT Received On: 10/08/12 Assistance Needed: +1 PT/OT  Co-Evaluation/Treatment: Yes History of Present Illness: Pt is admitted with HA and confusion.  She lives with her elderly husband and is normally independent with a walker at home.       Prior Functioning  Home Living Family/patient expects to be discharged to:: Private residence Living Arrangements: Spouse/significant other Type of Home: House Home Access: Ramped entrance Home Equipment: Environmental consultant - 2 wheels;Cane - single point;Bedside commode;Tub bench Prior Function Level of Independence: Independent with assistive device(s) Communication Communication: No difficulties Dominant Hand: Right    Cognition  Cognition Arousal/Alertness: Awake/alert Behavior During Therapy: WFL for tasks assessed/performed Overall Cognitive Status: Within Functional Limits for tasks assessed    Extremity/Trunk Assessment Upper Extremity Assessment Upper Extremity Assessment: Overall WFL for tasks assessed Lower Extremity Assessment Lower Extremity Assessment: Overall WFL for tasks assessed   Balance Balance Balance Assessed:  (WNL by functional observation)  End of Session PT - End of Session Equipment Utilized During Treatment: Gait belt Activity Tolerance: Patient tolerated treatment well Patient left: in chair;with call bell/phone within reach;with family/visitor present  GP Functional Assessment Tool Used: clinical judgement Functional Limitation: Mobility: Walking and moving around Mobility: Walking and Moving Around Current Status [phone]): 0 percent impaired, limited or restricted Mobility: Walking and Moving Around Goal Status [phone]): 0 percent impaired, limited or restricted Mobility: Walking and Moving Around Discharge Status [phone]): 0 percent impaired, limited or restricted   Myrlene Broker L 10/08/2012, 9:15 AM

## 2012-10-08 NOTE — Progress Notes (Signed)
EEG Completed; Results Pending  

## 2012-10-08 NOTE — Progress Notes (Signed)
UR Chart Review Completed  

## 2012-10-08 NOTE — Progress Notes (Signed)
*  PRELIMINARY RESULTS* Echocardiogram 2D Echocardiogram has been performed.  Yevette Knust 10/08/2012, 3:47 PM

## 2012-10-09 LAB — HOMOCYSTEINE: Homocysteine: 11.4 umol/L (ref 4.0–15.4)

## 2012-10-10 NOTE — Procedures (Signed)
[facility] Tracey Walsh A. Tracey Pilgrim, MD     [email or web address]           NAMEHENNA, [name] NO.:  [id number]  MEDICAL RECORD NO.:  [id number]  LOCATION:  EE                           FACILITY:  [facility]  PHYSICIAN:  Maday Guarino A. Tracey Walsh, M.D. DATE OF BIRTH:  [birth year]  DATE OF PROCEDURE:  10/08/2012 DATE OF DISCHARGE:  10/08/2012                             EEG INTERPRETATION   INDICATIONS:  An 79 year old female who presents with altered mental status, confusion.  The study being done to evaluate for nonconvulsive seizures.  MEDICATIONS:  Acetaminophen, aspirin, enoxaparin, Ativan, Protonix.  ANALYSIS:  This is a 16-channel recording using standard 10/20 measurement, was carried out for 20 minutes.  There is a well-formed posterior dominant rhythm of 10-10.5 Hz, which attenuates with eye opening.  There is beta activity observed in frontal areas.  Awake and sleep activities are recorded.  Stage II non-REM sleep with K complexes and sleep spindles are observed.  There is also transient sharps noted in temporal lobes, were thought to be nonepileptic.  There is no focal or lateralized slowing.  There is no epileptiform activity observed.  IMPRESSION:  This is a normal recording of the awake and sleep states.     [name] A. Tracey Walsh, M.D.     KAD/MEDQ  D:  10/10/2012  T:  10/10/2012  Job:  [id number]

## 2012-10-10 NOTE — Discharge Summary (Signed)
Physician Discharge Summary  Patient ID: Tracey Walsh MRN: [id number] DOB/AGE: [birth year] 79 y.o. Primary Care Physician:Jearlene Bridwell L, MD Admit date: 10/07/2012 Discharge date: 10/10/2012    Discharge Diagnoses:   Principal Problem:   TIA (transient ischemic attack) Active Problems:   Dysphasia   HTN (hypertension)     Medication List         acetaminophen 500 MG tablet  Commonly known as:  TYLENOL  Take 500 mg by mouth every 6 (six) hours as needed for pain.     AZO STANDARD MAXIMUM STRENGTH 97.5 MG Tabs  Generic drug:  Phenazopyridine HCl  Take 1-2 tablets by mouth daily as needed (for prevention of UTI).     AZOR 10-20 MG per tablet  Generic drug:  amlodipine-olmesartan  Take 1 tablet by mouth daily.     clopidogrel 75 MG tablet  Commonly known as:  PLAVIX  Take 1 tablet (75 mg total) by mouth daily.     COMBIGAN 0.2-0.5 % ophthalmic solution  Generic drug:  brimonidine-timolol  Apply 1 drop to eye 2 (two) times daily.     LORazepam 0.5 MG tablet  Commonly known as:  ATIVAN  Take 0.5 mg by mouth 2 (two) times daily as needed.     LUMIGAN 0.01 % Soln  Generic drug:  bimatoprost  Apply 1 drop to eye at bedtime.     pantoprazole 40 MG tablet  Commonly known as:  PROTONIX  Take 40 mg by mouth daily as needed (for heartburn/acid reflux). Prescribed one tablet twice daily        Discharged Condition: Improved    Consults: Neurology, Dr. Gerilyn Pilgrim  Significant Diagnostic Studies: Dg Chest 2 View  10/07/2012   CLINICAL DATA:  Altered mental status. No chest complaints.  EXAM: CHEST  2 VIEW  COMPARISON:  None.  FINDINGS: The cardiopericardial silhouette is upper limits of normal for projection. There is abnormal morphology of right infrahilar lung, with findings suggesting bronchiectasis. A right hilar mass cannot be excluded and a followup chest CT, preferably with infusion is recommended. This can be performed on a nonemergent basis. There is no  airspace disease or pleural effusion. Aortic atherosclerosis. Bilateral pleural apical scarring is present. Age indeterminate mid thoracic compression fractures are present. Dextro convex lower thoracic scoliosis.  IMPRESSION: 1. No acute cardiopulmonary disease. 2. Fullness of the right hilum and infrahilar region. This may represent chronic bronchitic change however right hilar mass is not possible to exclude and followup chest CT, preferably within effusion is recommended. This can be performed on a nonemergent basis.   Electronically Signed   By: Andreas Newport M.D.   On: 10/07/2012 15:29   Ct Head Wo Contrast  10/07/2012   CLINICAL DATA:  79 year old female with altered mental status. Confusion. Intermittent speech difficulty. Last seem normal on 10/06/2012.  EXAM: CT HEAD WITHOUT CONTRAST  TECHNIQUE: Contiguous axial images were obtained from the base of the skull through the vertex without intravenous contrast.  COMPARISON:  None.  FINDINGS: Visualized paranasal sinuses and mastoids are clear. No acute osseous abnormality identified. Visualized orbits and scalp soft tissues are within normal limits.  Mild Calcified atherosclerosis at the skull base. Cerebral volume is within normal limits for age. No ventriculomegaly. No midline shift, mass effect, or evidence of intracranial mass lesion. No acute intracranial hemorrhage identified. No evidence of cortically based acute infarction identified. Normal for age gray-white matter differentiation. No suspicious intracranial vascular hyperdensity.  IMPRESSION: Normal for age noncontrast CT appearance of the  brain.   Electronically Signed   By: Augusto Gamble M.D.   On: 10/07/2012 15:35   Mri Brain Without Contrast  10/08/2012   CLINICAL DATA:  Confusion and weakness. Trouble speaking. Off balance.  EXAM: MRI HEAD WITHOUT CONTRAST  MRA HEAD WITHOUT CONTRAST  TECHNIQUE: Multiplanar, multiecho pulse sequences of the brain and surrounding structures were obtained  without intravenous contrast. Angiographic images of the head were obtained using MRA technique without contrast.  COMPARISON:  Head CT from the same day  FINDINGS: MRI HEAD FINDINGS  Calvarium and upper cervical spine: No marrow signal abnormality.  Orbits: No significant findings.  Sinuses: Clear. Mastoid and middle ears are clear.  Brain: No acute abnormality such as acute infarct, hemorrhage, hydrocephalus, or mass lesion. No evidence of large vessel occlusion.  The CSF around the cerebellar hemispheres is at out of proportion to foliar enlargement, compatible with hygromas. These do not efface the 4th ventricle or significantly compress the cerebellum.  Patchy bilateral cerebral white matter T2 and FLAIR hyperintensity, consistent with chronic small vessel ischemia.  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  MRA HEAD FINDINGS  No acute abnormality such is large vessel occlusion.  No aneurysm.  There are multiple luminal irregularities of the intracranial vessels, predominant distal, consistent with atherosclerosis. The most proximal stenosis is a moderate narrowing of the distal right A1 segment. Notable left M3 segment stenoses to the upper division. There are moderate bilateral pericallosal stenoses additionally.  Posterior circulation shows left dominance. The proximal V3 segments show poor signal related to trajectory of the vessels. .  IMPRESSION: MRI HEAD IMPRESSION  1. Negative for acute intracranial abnormality. 2. Senescent changes including atrophy and chronic small vessel ischemia. 3. 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  MRA HEAD IMPRESSION  1. No acute arterial abnormality. 2. Intracranial atherosclerosis as above. The most proximal stenosis is a moderate narrowing of the distal right A1 segment.   Electronically Signed   By: Tiburcio Pea   On: 10/08/2012 04:57   US Carotid Duplex Bilateral  10/08/2012   *RADIOLOGY REPORT*  Clinical Data: TIA/stroke, history of  hypertension, initial encounter.  BILATERAL CAROTID DUPLEX ULTRASOUND  Technique: Wallace Cullens scale imaging, color Doppler and duplex ultrasound were performed of bilateral carotid and vertebral arteries in the neck.  Comparison:  Brain MRI - 10/07/2012  Criteria:  Quantification of carotid stenosis is based on velocity parameters that correlate the residual internal carotid diameter with NASCET-based stenosis levels, using the diameter of the distal internal carotid lumen as the denominator for stenosis measurement.  The following velocity measurements were obtained:                   PEAK SYSTOLIC/END DIASTOLIC RIGHT ICA:                        63/11cm/sec CCA:                        110/10cm/sec SYSTOLIC ICA/CCA RATIO:     0.57 DIASTOLIC ICA/CCA RATIO:    1.13 ECA:                        79cm/sec  LEFT ICA:                        88/13cm/sec CCA:  101/14cm/sec SYSTOLIC ICA/CCA RATIO:     0.88 DIASTOLIC ICA/CCA RATIO:    0.96 ECA:                        127cm/sec  Findings:  RIGHT CAROTID ARTERY: There is a very minimal amount of eccentric intimal wall thickening within the right carotid bulb (images 14 and 16), not resulting elevated peak systolic velocities within the right internal carotid artery to suggest a hemodynamically significant stenosis.  RIGHT VERTEBRAL ARTERY:  Antegrade flow  LEFT CAROTID ARTERY: There is a minimal amount of eccentric intimal wall thickening within the left carotid bulb (images 48 and 50), not resulting elevated peak systolic velocity within the left internal carotid artery to suggest a hemodynamically significant stenosis.  LEFT VERTEBRAL ARTERY:  Antegrade flow  IMPRESSION: Very minimal amount of intimal wall thickening within the bilateral carotid bulbs, not resulting in hemodynamically significant stenosis.   Original Report Authenticated By: Tacey Ruiz, MD   Mr Mra Head/brain Wo Cm  10/08/2012   CLINICAL DATA:  Confusion and weakness. Trouble speaking. Off  balance.  EXAM: MRI HEAD WITHOUT CONTRAST  MRA HEAD WITHOUT CONTRAST  TECHNIQUE: Multiplanar, multiecho pulse sequences of the brain and surrounding structures were obtained without intravenous contrast. Angiographic images of the head were obtained using MRA technique without contrast.  COMPARISON:  Head CT from the same day  FINDINGS: MRI HEAD FINDINGS  Calvarium and upper cervical spine: No marrow signal abnormality.  Orbits: No significant findings.  Sinuses: Clear. Mastoid and middle ears are clear.  Brain: No acute abnormality such as acute infarct, hemorrhage, hydrocephalus, or mass lesion. No evidence of large vessel occlusion.  The CSF around the cerebellar hemispheres is at out of proportion to foliar enlargement, compatible with hygromas. These do not efface the 4th ventricle or significantly compress the cerebellum.  Patchy bilateral cerebral white matter T2 and FLAIR hyperintensity, consistent with chronic small vessel ischemia.  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  MRA HEAD FINDINGS  No acute abnormality such is large vessel occlusion.  No aneurysm.  There are multiple luminal irregularities of the intracranial vessels, predominant distal, consistent with atherosclerosis. The most proximal stenosis is a moderate narrowing of the distal right A1 segment. Notable left M3 segment stenoses to the upper division. There are moderate bilateral pericallosal stenoses additionally.  Posterior circulation shows left dominance. The proximal V3 segments show poor signal related to trajectory of the vessels. .  IMPRESSION: MRI HEAD IMPRESSION  1. Negative for acute intracranial abnormality. 2. Senescent changes including atrophy and chronic small vessel ischemia. 3. 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  MRA HEAD IMPRESSION  1. No acute arterial abnormality. 2. Intracranial atherosclerosis as above. The most proximal stenosis is a moderate narrowing of the distal right A1 segment.    Electronically Signed   By: Tiburcio Pea   On: 10/08/2012 04:57    Lab Results: Basic Metabolic Panel:  Recent Labs  30/86/57 1537  NA 139  K 3.6  CL 102  CO2 27  GLUCOSE 108*  BUN 15  CREATININE 0.96  CALCIUM 9.8   Liver Function Tests:  Recent Labs  10/07/12 1537  AST 17  ALT 12  ALKPHOS 55  BILITOT 0.4  PROT 6.6  ALBUMIN 3.7     CBC:  Recent Labs  10/07/12 1537  WBC 5.9  NEUTROABS 3.6  HGB 13.6  HCT 40.2  MCV 94.8  PLT 220  No results found for this or any previous visit (from the past 240 hour(s)).   Hospital Course: She came to the emergency department with change in mental status. She had been having a severe headache. She has previously had headaches that cause be mildly confused but this was much worse than usual and she had some speech deficit. She had CT scan that did not show anything specific and then MRI scan that also did not show any definite acute infarction. She had carotid study that did not show evidence of severe stenosis. The next morning she was much improved back to baseline as far as her mental status was concerned. Neurology consultation was obtained and it was felt that she should have an EEG as well but otherwise could be discharged home.  Discharge Exam: Blood pressure 146/64, pulse 81, temperature 97.4 F (36.3 C), temperature source Oral, resp. rate 18, SpO2 100.00%. She is awake and alert. Her chest is clear. Her heart is regular.  Disposition: Home she will need CT of the chest because of some abnormality seen in the hilum. She will follow my office and with Dr. Gerilyn Pilgrim      Discharge Orders   Future Orders Complete By Expires   Discharge patient  As directed    Discharge patient  As directed         Signed: Paytience Bures L Pager [phone]  10/10/2012, 9:00 AM

## 2012-11-28 ENCOUNTER — Ambulatory Visit (HOSPITAL_COMMUNITY)
Admission: RE | Admit: 2012-11-28 | Discharge: 2012-11-28 | Disposition: A | Discharge status: Home / Self Care | Payer: Medicare Other | Source: Ambulatory Visit | Attending: Pulmonary Disease | Admitting: Pulmonary Disease

## 2012-11-28 ENCOUNTER — Other Ambulatory Visit (HOSPITAL_COMMUNITY): Payer: Self-pay | Admitting: Pulmonary Disease

## 2012-11-28 DIAGNOSIS — R9389 Abnormal findings on diagnostic imaging of other specified body structures: Secondary | ICD-10-CM

## 2012-11-28 DIAGNOSIS — R0602 Shortness of breath: Secondary | ICD-10-CM | POA: Insufficient documentation

## 2013-08-28 ENCOUNTER — Ambulatory Visit (INDEPENDENT_AMBULATORY_CARE_PROVIDER_SITE_OTHER): Payer: Medicare Other | Admitting: Otolaryngology

## 2013-08-28 DIAGNOSIS — H6120 Impacted cerumen, unspecified ear: Secondary | ICD-10-CM

## 2013-08-28 DIAGNOSIS — H903 Sensorineural hearing loss, bilateral: Secondary | ICD-10-CM

## 2013-09-10 ENCOUNTER — Ambulatory Visit (INDEPENDENT_AMBULATORY_CARE_PROVIDER_SITE_OTHER): Payer: Medicare Other | Admitting: Orthopedic Surgery

## 2013-09-10 ENCOUNTER — Ambulatory Visit (INDEPENDENT_AMBULATORY_CARE_PROVIDER_SITE_OTHER): Payer: Medicare Other

## 2013-09-10 VITALS — BP 124/91 | Ht 60.0 in | Wt 128.8 lb

## 2013-09-10 DIAGNOSIS — M25569 Pain in unspecified knee: Secondary | ICD-10-CM

## 2013-09-10 DIAGNOSIS — M25561 Pain in right knee: Secondary | ICD-10-CM

## 2013-09-10 DIAGNOSIS — M1711 Unilateral primary osteoarthritis, right knee: Secondary | ICD-10-CM

## 2013-09-10 DIAGNOSIS — M1710 Unilateral primary osteoarthritis, unspecified knee: Secondary | ICD-10-CM

## 2013-09-10 NOTE — Patient Instructions (Signed)
Brace knee when up walking

## 2013-09-11 ENCOUNTER — Ambulatory Visit: Payer: Medicare Other | Admitting: Orthopedic Surgery

## 2013-09-11 ENCOUNTER — Encounter: Payer: Self-pay | Admitting: Orthopedic Surgery

## 2013-09-11 NOTE — Progress Notes (Signed)
Chief Complaint  Patient presents with  . Knee Pain    right knee pain, no known injury   Consult request Dr. Juanetta Gosling This is an 80 year old female who lives by herself present with atraumatic onset of dull aching right knee pain without radiation. No catching locking or giving way.  Review of systems  Past Medical History  Diagnosis Date  . HTN (hypertension)   . Glaucoma    Past Surgical History  Procedure Laterality Date  . None      Family History  Problem Relation Age of Onset  . Hypertension Father   . Diabetes Mother    History  Substance Use Topics  . Smoking status: Never Smoker   . Smokeless tobacco: Not on file  . Alcohol Use: No    BP 124/91  Ht 5' (1.524 m)  Wt 128 lb 12.8 oz (58.423 kg)  BMI 25.15 kg/m2 General appearance is normal She has a normal pleasant mood and affect She is awake alert and oriented x3 She ambulates slowly with a cane  Upper extremity exam  The right and left upper extremity:   Inspection and palpation revealed no gross abnormalities in the upper extremities.   Range of motion is full without minimal contracture.  Motor exam is normal with grade 5 strength.  The joints are fully reduced without subluxation.  There is no atrophy or tremor and muscle tone is normal.  All joints are stable.     The cardiovascular exam reveals normal pulses and temperature without edema or  swelling.  The lymphatic system is negative for palpable lymph nodes  The sensory exam is normal.  There are no pathologic reflexes.  Balance is normal.   Left knee full range of motion strength stability alignment skin is normal  Exam of the right knee shows valgus deformity with lateral joint line tenderness, small joint effusion. Knee flexion 125 knee extension -5 stability test normal  Strength grade 5 motor strength  Skin normal, no rash, or laceration. Provocative tests meniscal test negative  A/P X-rays show valgus  osteoarthritis  I spoke to her and the person with her who is a friend about her recent TIA and that if her pain is bearable she is probably better off not having knee replacement surgery. We gave her brace to wear. We did discuss the possibility of further health deterioration causing her to not be able to have the surgery but overall this is probably the best treatment at this point is nonoperative. I would like to see her in 3-4 months to see if the knee has worsened because we have a short window of time that we can do the surgery because of her advancing age, Plavix, and deteriorating function.

## 2013-12-16 ENCOUNTER — Ambulatory Visit: Payer: Medicare Other | Admitting: Orthopedic Surgery

## 2014-01-13 ENCOUNTER — Ambulatory Visit (INDEPENDENT_AMBULATORY_CARE_PROVIDER_SITE_OTHER): Payer: Medicare Other | Admitting: Orthopedic Surgery

## 2014-01-13 VITALS — BP 139/79 | Ht 60.0 in | Wt 128.8 lb

## 2014-01-13 DIAGNOSIS — M129 Arthropathy, unspecified: Secondary | ICD-10-CM

## 2014-01-13 DIAGNOSIS — M1711 Unilateral primary osteoarthritis, right knee: Secondary | ICD-10-CM

## 2014-01-13 NOTE — Progress Notes (Signed)
Patient ID: Tracey EdisAileen O Walsh, female   DOB: 1924/07/20, 80 y.o.   MRN: [id number] Follow-up visit  Chief Complaint  Patient presents with  . Follow-up    3 month recheck right knee s/p brace    The patient had a knee brace for her valgus osteoarthritis she didn't tolerate it because it didn't give her a good fit, probably because she is in such valgus alignment  She presents for reevaluation of right knee pain taking Tylenol at this point on an intermittent basis with intermittent symptoms  Review of systems is negative at this time related to her musculoskeletal system  Her knee is in valgus she walks with a walker no real tenderness but crepitance range of motion 120 of flexion full extension valgus alignment knee stable motor exam normal skin intact  She is interested in injection  Reinjected right knee continue Tylenol for pain okay to call for reinjection 1 month if she gets good relief  Procedure note right knee injection verbal consent was obtained to inject right knee joint  Timeout was completed to confirm the site of injection  The medications used were 40 mg of Depo-Medrol and 1% lidocaine 3 cc  Anesthesia was provided by ethyl chloride and the skin was prepped with alcohol.  After cleaning the skin with alcohol a 20-gauge needle was used to inject the right knee joint. There were no complications. A sterile bandage was applied.

## 2014-01-16 DIAGNOSIS — G459 Transient cerebral ischemic attack, unspecified: Secondary | ICD-10-CM

## 2014-01-16 HISTORY — DX: Transient cerebral ischemic attack, unspecified: G45.9

## 2014-03-02 ENCOUNTER — Ambulatory Visit: Payer: Medicare Other | Admitting: Orthopedic Surgery

## 2014-03-12 ENCOUNTER — Ambulatory Visit (INDEPENDENT_AMBULATORY_CARE_PROVIDER_SITE_OTHER): Payer: Medicare HMO | Admitting: Orthopedic Surgery

## 2014-03-12 DIAGNOSIS — M129 Arthropathy, unspecified: Secondary | ICD-10-CM

## 2014-03-12 DIAGNOSIS — M1711 Unilateral primary osteoarthritis, right knee: Secondary | ICD-10-CM

## 2014-03-12 DIAGNOSIS — M25561 Pain in right knee: Secondary | ICD-10-CM

## 2014-03-12 NOTE — Progress Notes (Signed)
Requests repeat injection right knee  Diagnosis osteoarthritis  Procedure note right knee injection verbal consent was obtained to inject right knee joint  Timeout was completed to confirm the site of injection  The medications used were 40 mg of Depo-Medrol and 1% lidocaine 3 cc  Anesthesia was provided by ethyl chloride and the skin was prepped with alcohol.  After cleaning the skin with alcohol a 20-gauge needle was used to inject the right knee joint. There were no complications. A sterile bandage was applied.

## 2014-09-10 ENCOUNTER — Ambulatory Visit (INDEPENDENT_AMBULATORY_CARE_PROVIDER_SITE_OTHER): Payer: Medicare HMO | Admitting: Otolaryngology

## 2014-09-10 DIAGNOSIS — H903 Sensorineural hearing loss, bilateral: Secondary | ICD-10-CM | POA: Diagnosis not present

## 2015-04-19 DIAGNOSIS — H2513 Age-related nuclear cataract, bilateral: Secondary | ICD-10-CM | POA: Diagnosis not present

## 2015-04-19 DIAGNOSIS — H401132 Primary open-angle glaucoma, bilateral, moderate stage: Secondary | ICD-10-CM | POA: Diagnosis not present

## 2015-05-24 DIAGNOSIS — I10 Essential (primary) hypertension: Secondary | ICD-10-CM | POA: Diagnosis not present

## 2015-05-24 DIAGNOSIS — M159 Polyosteoarthritis, unspecified: Secondary | ICD-10-CM | POA: Diagnosis not present

## 2015-05-24 DIAGNOSIS — N3281 Overactive bladder: Secondary | ICD-10-CM | POA: Diagnosis not present

## 2015-05-24 DIAGNOSIS — K219 Gastro-esophageal reflux disease without esophagitis: Secondary | ICD-10-CM | POA: Diagnosis not present

## 2015-10-19 ENCOUNTER — Emergency Department (HOSPITAL_COMMUNITY): Payer: PPO

## 2015-10-19 ENCOUNTER — Encounter (HOSPITAL_COMMUNITY): Payer: Self-pay | Admitting: Emergency Medicine

## 2015-10-19 ENCOUNTER — Emergency Department (HOSPITAL_COMMUNITY)
Admission: EM | Admit: 2015-10-19 | Discharge: 2015-10-19 | Disposition: A | Discharge status: Home / Self Care | Payer: PPO | Attending: Emergency Medicine | Admitting: Emergency Medicine

## 2015-10-19 DIAGNOSIS — W1809XA Striking against other object with subsequent fall, initial encounter: Secondary | ICD-10-CM | POA: Insufficient documentation

## 2015-10-19 DIAGNOSIS — Z79899 Other long term (current) drug therapy: Secondary | ICD-10-CM | POA: Insufficient documentation

## 2015-10-19 DIAGNOSIS — S20212A Contusion of left front wall of thorax, initial encounter: Secondary | ICD-10-CM | POA: Diagnosis not present

## 2015-10-19 DIAGNOSIS — Y9301 Activity, walking, marching and hiking: Secondary | ICD-10-CM | POA: Insufficient documentation

## 2015-10-19 DIAGNOSIS — Y999 Unspecified external cause status: Secondary | ICD-10-CM | POA: Diagnosis not present

## 2015-10-19 DIAGNOSIS — I10 Essential (primary) hypertension: Secondary | ICD-10-CM | POA: Insufficient documentation

## 2015-10-19 DIAGNOSIS — Y929 Unspecified place or not applicable: Secondary | ICD-10-CM | POA: Insufficient documentation

## 2015-10-19 DIAGNOSIS — S299XXA Unspecified injury of thorax, initial encounter: Secondary | ICD-10-CM | POA: Diagnosis not present

## 2015-10-19 DIAGNOSIS — R0781 Pleurodynia: Secondary | ICD-10-CM | POA: Diagnosis not present

## 2015-10-19 HISTORY — DX: Transient cerebral ischemic attack, unspecified: G45.9

## 2015-10-19 MED ORDER — HYDROCODONE-ACETAMINOPHEN 5-325 MG PO TABS: 1.0000 | ORAL_TABLET | Freq: Four times a day (QID) | ORAL | 0 refills | Status: DC | PRN

## 2015-10-19 NOTE — Discharge Instructions (Signed)
Take tylenol every 4 hrs and use ice as needed. Use spirometer every few hours while awake until pain resolved.  For severe pain take norco or vicodin however realize they have the potential for addiction and it can make you sleepy and has tylenol in it.  No operating machinery while taking.  If you were given medicines take as directed.  If you are on coumadin or contraceptives realize their levels and effectiveness is altered by many different medicines.  If you have any reaction (rash, tongues swelling, other) to the medicines stop taking and see a physician.    If your blood pressure was elevated in the ER make sure you follow up for management with a primary doctor or return for chest pain, shortness of breath or stroke symptoms.  Please follow up as directed and return to the ER or see a physician for new or worsening symptoms.  Thank you. Vitals:   10/19/15 1002  BP: 145/80  Pulse: 79  Resp: 18  Temp: 97.6 F (36.4 C)  TempSrc: Oral  SpO2: 98%  Weight: 142 lb (64.4 kg)  Height: 5' (1.524 m)

## 2015-10-19 NOTE — ED Notes (Addendum)
Called Respiratory for education in incentive spirometer.

## 2015-10-19 NOTE — ED Triage Notes (Signed)
Pt reports falling on Sunday night while going to the bathroom.  States she fell and hit her left ribs on the arm of a chair.  Bruise noted to left rib area.

## 2015-10-19 NOTE — ED Provider Notes (Signed)
[facility]-EMERGENCY DEPT Provider Note   CSN: [id number] Arrival date & time: 10/19/15  5784  By signing my name below, I, [name], attest that this documentation has been prepared under the direction and in the presence of Blane Ohara, MD. Electronically Signed: [name], ED Scribe. 10/19/15. 10:19 AM.   History   Chief Complaint Chief Complaint  Patient presents with  . Fall    left rib pain   HPI HPI Comments: Tracey Walsh is a 82 y.o. female who presents to the Emergency Department complaining of a mechanical fall that occurred two days ago. Pt states she was ambulating with her walker and fell landing on her left torso on the arm of a cushioned chair. Pt has experienced persistent left rib pain since her fall as well as a mild bruise to her left hip although she denies left hip pain. She has taken tylenol which provides mild short term significant relief and denies she has taken any pain medications today. She confirms having ambulated since her fall. She is currently taking Plavix. Pt denies SOB, head trauma, LOC or other associated symptoms at this time.   The history is provided by the patient and medical records. No language interpreter was used.    Past Medical History:  Diagnosis Date  . Glaucoma   . HTN (hypertension)   . Transient ischemic attack (TIA)     Patient Active Problem List   Diagnosis Date Noted  . TIA (transient ischemic attack) 10/07/2012  . Dysphasia 10/07/2012  . HTN (hypertension) 10/07/2012    Past Surgical History:  Procedure Laterality Date  . None      OB History    No data available       Home Medications    Prior to Admission medications   Medication Sig Start Date End Date Taking? Authorizing Provider  acetaminophen (TYLENOL) 500 MG tablet Take 500 mg by mouth every 6 (six) hours as needed for pain.   Yes Historical Provider, MD  ALPRAZolam (XANAX) 0.5 MG tablet 1 tablet 3 (three) times daily as needed. 08/18/15  Yes  Historical Provider, MD  amLODipine (NORVASC) 5 MG tablet 1 tablet daily. 10/18/15  Yes Historical Provider, MD  CALCIUM PO Take 2 tablets by mouth daily.   Yes Historical Provider, MD  clopidogrel (PLAVIX) 75 MG tablet Take 1 tablet (75 mg total) by mouth daily. 10/08/12  Yes [name], MD  COMBIGAN 0.2-0.5 % ophthalmic solution Apply 1 drop to eye 2 (two) times daily.  07/31/12  Yes Historical Provider, MD  CRANBERRY PO Take 2 tablets by mouth daily.   Yes Historical Provider, MD  LUMIGAN 0.01 % SOLN Apply 1 drop to eye at bedtime.  10/02/12  Yes Historical Provider, MD  pantoprazole (PROTONIX) 40 MG tablet Take 40 mg by mouth 2 (two) times daily. Prescribed one tablet twice daily 07/31/12  Yes Historical Provider, MD  Probiotic Product (TRUBIOTICS PO) Take 1 tablet by mouth daily.   Yes Historical Provider, MD  HYDROcodone-acetaminophen (NORCO) 5-325 MG tablet Take 1 tablet by mouth every 6 (six) hours as needed for severe pain. 10/19/15   Blane Ohara, MD    Family History Family History  Problem Relation Age of Onset  . Hypertension Father   . Diabetes Mother     Social History Social History  Substance Use Topics  . Smoking status: Never Smoker  . Smokeless tobacco: Not on file  . Alcohol use No     Allergies   Review of  patient's allergies indicates no known allergies.   Review of Systems Review of Systems  Respiratory: Negative for shortness of breath.   Musculoskeletal: Positive for arthralgias and myalgias.  Skin: Positive for color change.  Neurological: Negative for syncope and headaches.  All other systems reviewed and are negative.  Physical Exam Updated Vital Signs BP 145/80 (BP Location: Left Arm)   Pulse 79   Temp 97.6 F (36.4 C) (Oral)   Resp 18   Ht 5' (1.524 m)   Wt 142 lb (64.4 kg)   SpO2 98%   BMI 27.73 kg/m   Physical Exam  Constitutional: She is oriented to person, place, and time. She appears well-developed and well-nourished.  HENT:    Head: Normocephalic and atraumatic.  Eyes: EOM are normal.  Neck: Normal range of motion.  Cardiovascular: Normal rate.   Pulmonary/Chest: Effort normal and breath sounds normal. No respiratory distress. She has no wheezes. She has no rales.  Abdominal: Soft.  Musculoskeletal: Normal range of motion. She exhibits tenderness.  Mild tenderness and ecchymosis over left lower lateral ribs. No pain to left hip with ROM.   Neurological: She is alert and oriented to person, place, and time.  Skin: Skin is warm and dry. Ecchymosis noted.  Psychiatric: She has a normal mood and affect.  Nursing note and vitals reviewed.  ED Treatments / Results  Labs (all labs ordered are listed, but only abnormal results are displayed) Labs Reviewed - No data to display  EKG  EKG Interpretation None      Radiology Dg Ribs Unilateral W/chest Left  Result Date: 10/19/2015 CLINICAL DATA:  Left lower rib pain, fell recently striking at chair EXAM: LEFT RIBS AND CHEST - 3+ VIEW COMPARISON:  Chest x-ray of 11/28/2012 FINDINGS: There is linear atelectasis or scarring at the left lung base. No pneumonia or effusion is seen and there is no evidence of pneumothorax. The heart is within upper limits of normal. On the left rib detail films obtained, no acute fracture is seen. IMPRESSION: 1. Linear atelectasis or scarring at the left lung base. No pleural effusion. 2. Negative left rib detail. Electronically Signed   By: Dwyane DeePaul  Barry M.D.   On: 10/19/2015 11:02    Procedures Procedures  COORDINATION OF CARE: 10:18 AM Discussed next steps with pt. Pt verbalized understanding and is agreeable with the plan.    Medications Ordered in ED Medications - No data to display   Initial Impression / Assessment and Plan / ED Course  I have reviewed the triage vital signs and the nursing notes.  Pertinent labs & imaging results that were available during my care of the patient were reviewed by me and considered in my  medical decision making (see chart for details).  Clinical Course   Patient with low risk fall and rib contusion. X-ray no fracture. Patient not requiring auction. Spirometer and supportive care. Results and differential diagnosis were discussed with the patient/parent/guardian. Xrays were independently reviewed by myself.  Close follow up outpatient was discussed, comfortable with the plan.   Medications - No data to display  Vitals:   10/19/15 1002  BP: 145/80  Pulse: 79  Resp: 18  Temp: 97.6 F (36.4 C)  TempSrc: Oral  SpO2: 98%  Weight: 142 lb (64.4 kg)  Height: 5' (1.524 m)    Final diagnoses:  Rib contusion, left, initial encounter    Final Clinical Impressions(s) / ED Diagnoses   Final diagnoses:  Rib contusion, left, initial encounter  New Prescriptions New Prescriptions   HYDROCODONE-ACETAMINOPHEN (NORCO) 5-325 MG TABLET    Take 1 tablet by mouth every 6 (six) hours as needed for severe pain.     Blane Ohara, MD 10/19/15 1130

## 2015-10-20 DIAGNOSIS — N390 Urinary tract infection, site not specified: Secondary | ICD-10-CM | POA: Diagnosis not present

## 2015-11-04 ENCOUNTER — Ambulatory Visit (INDEPENDENT_AMBULATORY_CARE_PROVIDER_SITE_OTHER): Payer: PPO | Admitting: Otolaryngology

## 2015-11-15 DIAGNOSIS — H401132 Primary open-angle glaucoma, bilateral, moderate stage: Secondary | ICD-10-CM | POA: Diagnosis not present

## 2015-11-23 DIAGNOSIS — Z0000 Encounter for general adult medical examination without abnormal findings: Secondary | ICD-10-CM | POA: Diagnosis not present

## 2015-11-23 DIAGNOSIS — Z23 Encounter for immunization: Secondary | ICD-10-CM | POA: Diagnosis not present

## 2015-11-26 DIAGNOSIS — M159 Polyosteoarthritis, unspecified: Secondary | ICD-10-CM | POA: Diagnosis not present

## 2015-11-26 DIAGNOSIS — I10 Essential (primary) hypertension: Secondary | ICD-10-CM | POA: Diagnosis not present

## 2015-11-26 DIAGNOSIS — N3281 Overactive bladder: Secondary | ICD-10-CM | POA: Diagnosis not present

## 2015-11-26 DIAGNOSIS — Z0000 Encounter for general adult medical examination without abnormal findings: Secondary | ICD-10-CM | POA: Diagnosis not present

## 2015-11-26 DIAGNOSIS — K589 Irritable bowel syndrome without diarrhea: Secondary | ICD-10-CM | POA: Diagnosis not present

## 2015-11-26 DIAGNOSIS — K219 Gastro-esophageal reflux disease without esophagitis: Secondary | ICD-10-CM | POA: Diagnosis not present

## 2015-11-26 DIAGNOSIS — F419 Anxiety disorder, unspecified: Secondary | ICD-10-CM | POA: Diagnosis not present

## 2015-11-26 DIAGNOSIS — M810 Age-related osteoporosis without current pathological fracture: Secondary | ICD-10-CM | POA: Diagnosis not present

## 2015-11-26 DIAGNOSIS — H409 Unspecified glaucoma: Secondary | ICD-10-CM | POA: Diagnosis not present

## 2015-12-03 ENCOUNTER — Observation Stay (HOSPITAL_COMMUNITY): Payer: PPO

## 2015-12-03 ENCOUNTER — Inpatient Hospital Stay (HOSPITAL_COMMUNITY)
Admission: EM | Admit: 2015-12-03 | Discharge: 2015-12-07 | DRG: 193 | Disposition: A | Discharge status: Home Health | Payer: PPO | Attending: Pulmonary Disease | Admitting: Pulmonary Disease

## 2015-12-03 ENCOUNTER — Emergency Department (HOSPITAL_COMMUNITY): Payer: PPO

## 2015-12-03 ENCOUNTER — Encounter (HOSPITAL_COMMUNITY): Payer: Self-pay | Admitting: Emergency Medicine

## 2015-12-03 DIAGNOSIS — G9341 Metabolic encephalopathy: Secondary | ICD-10-CM | POA: Diagnosis present

## 2015-12-03 DIAGNOSIS — Z8673 Personal history of transient ischemic attack (TIA), and cerebral infarction without residual deficits: Secondary | ICD-10-CM | POA: Diagnosis not present

## 2015-12-03 DIAGNOSIS — R112 Nausea with vomiting, unspecified: Secondary | ICD-10-CM

## 2015-12-03 DIAGNOSIS — R05 Cough: Secondary | ICD-10-CM | POA: Diagnosis not present

## 2015-12-03 DIAGNOSIS — G459 Transient cerebral ischemic attack, unspecified: Secondary | ICD-10-CM | POA: Insufficient documentation

## 2015-12-03 DIAGNOSIS — Z833 Family history of diabetes mellitus: Secondary | ICD-10-CM | POA: Diagnosis not present

## 2015-12-03 DIAGNOSIS — I10 Essential (primary) hypertension: Secondary | ICD-10-CM | POA: Diagnosis not present

## 2015-12-03 DIAGNOSIS — R531 Weakness: Secondary | ICD-10-CM

## 2015-12-03 DIAGNOSIS — M6281 Muscle weakness (generalized): Secondary | ICD-10-CM

## 2015-12-03 DIAGNOSIS — Z79899 Other long term (current) drug therapy: Secondary | ICD-10-CM | POA: Diagnosis not present

## 2015-12-03 DIAGNOSIS — R110 Nausea: Secondary | ICD-10-CM

## 2015-12-03 DIAGNOSIS — J189 Pneumonia, unspecified organism: Principal | ICD-10-CM | POA: Diagnosis present

## 2015-12-03 DIAGNOSIS — Z7902 Long term (current) use of antithrombotics/antiplatelets: Secondary | ICD-10-CM | POA: Diagnosis not present

## 2015-12-03 DIAGNOSIS — H409 Unspecified glaucoma: Secondary | ICD-10-CM | POA: Diagnosis present

## 2015-12-03 DIAGNOSIS — N179 Acute kidney failure, unspecified: Secondary | ICD-10-CM | POA: Diagnosis not present

## 2015-12-03 DIAGNOSIS — F419 Anxiety disorder, unspecified: Secondary | ICD-10-CM | POA: Diagnosis present

## 2015-12-03 DIAGNOSIS — R4182 Altered mental status, unspecified: Secondary | ICD-10-CM | POA: Diagnosis not present

## 2015-12-03 DIAGNOSIS — Z8249 Family history of ischemic heart disease and other diseases of the circulatory system: Secondary | ICD-10-CM | POA: Diagnosis not present

## 2015-12-03 DIAGNOSIS — R410 Disorientation, unspecified: Secondary | ICD-10-CM | POA: Diagnosis not present

## 2015-12-03 DIAGNOSIS — J181 Lobar pneumonia, unspecified organism: Secondary | ICD-10-CM

## 2015-12-03 DIAGNOSIS — G9340 Encephalopathy, unspecified: Secondary | ICD-10-CM | POA: Diagnosis present

## 2015-12-03 DIAGNOSIS — Z66 Do not resuscitate: Secondary | ICD-10-CM | POA: Diagnosis present

## 2015-12-03 DIAGNOSIS — R488 Other symbolic dysfunctions: Secondary | ICD-10-CM

## 2015-12-03 DIAGNOSIS — G458 Other transient cerebral ischemic attacks and related syndromes: Secondary | ICD-10-CM

## 2015-12-03 LAB — COMPREHENSIVE METABOLIC PANEL
ALBUMIN: 4.3 g/dL (ref 3.5–5.0)
ALT: 13 U/L — ABNORMAL LOW (ref 14–54)
AST: 21 U/L (ref 15–41)
Alkaline Phosphatase: 69 U/L (ref 38–126)
Anion gap: 7 (ref 5–15)
BUN: 17 mg/dL (ref 6–20)
CALCIUM: 9.5 mg/dL (ref 8.9–10.3)
CO2: 28 mmol/L (ref 22–32)
Chloride: 104 mmol/L (ref 101–111)
Creatinine, Ser: 1.12 mg/dL — ABNORMAL HIGH (ref 0.44–1.00)
GFR calc non Af Amer: 42 mL/min — ABNORMAL LOW (ref >60)
GFR, EST AFRICAN AMERICAN: 49 mL/min — AB (ref >60)
GLUCOSE: 115 mg/dL — AB (ref 65–99)
POTASSIUM: 3.8 mmol/L (ref 3.5–5.1)
SODIUM: 139 mmol/L (ref 135–145)
Total Bilirubin: 0.9 mg/dL (ref 0.3–1.2)
Total Protein: 7.4 g/dL (ref 6.5–8.1)

## 2015-12-03 LAB — URINALYSIS, ROUTINE W REFLEX MICROSCOPIC
Bilirubin Urine: NEGATIVE
GLUCOSE, UA: NEGATIVE mg/dL
HGB URINE DIPSTICK: NEGATIVE
KETONES UR: NEGATIVE mg/dL
Nitrite: NEGATIVE
PH: 5.5 (ref 5.0–8.0)
Protein, ur: NEGATIVE mg/dL
Specific Gravity, Urine: 1.015 (ref 1.005–1.030)

## 2015-12-03 LAB — CBC
HEMATOCRIT: 44.6 % (ref 36.0–46.0)
Hemoglobin: 15.1 g/dL — ABNORMAL HIGH (ref 12.0–15.0)
MCH: 32.1 pg (ref 26.0–34.0)
MCHC: 33.9 g/dL (ref 30.0–36.0)
MCV: 94.9 fL (ref 78.0–100.0)
Platelets: 231 10*3/uL (ref 150–400)
RBC: 4.7 MIL/uL (ref 3.87–5.11)
RDW: 12.9 % (ref 11.5–15.5)
WBC: 6.5 10*3/uL (ref 4.0–10.5)

## 2015-12-03 LAB — URINE MICROSCOPIC-ADD ON

## 2015-12-03 LAB — CBG MONITORING, ED: GLUCOSE-CAPILLARY: 92 mg/dL (ref 65–99)

## 2015-12-03 MED ORDER — ALPRAZOLAM 0.5 MG PO TABS
0.5000 mg | ORAL_TABLET | Freq: Every evening | ORAL | Status: DC | PRN
  Administered 2015-12-04 – 2015-12-06 (×3): 0.5 mg via ORAL
  Filled 2015-12-03 (×4): qty 1

## 2015-12-03 MED ORDER — ALBUTEROL SULFATE (2.5 MG/3ML) 0.083% IN NEBU: 2.5000 mg | INHALATION_SOLUTION | RESPIRATORY_TRACT | Status: DC | PRN

## 2015-12-03 MED ORDER — ONDANSETRON HCL 4 MG PO TABS: 4.0000 mg | ORAL_TABLET | Freq: Four times a day (QID) | ORAL | Status: DC | PRN

## 2015-12-03 MED ORDER — ACETAMINOPHEN 325 MG PO TABS: 650.0000 mg | ORAL_TABLET | Freq: Four times a day (QID) | ORAL | Status: DC | PRN

## 2015-12-03 MED ORDER — HEPARIN SODIUM (PORCINE) 5000 UNIT/ML IJ SOLN
5000.0000 [IU] | Freq: Three times a day (TID) | INTRAMUSCULAR | Status: DC
  Administered 2015-12-03 – 2015-12-07 (×12): 5000 [IU] via SUBCUTANEOUS
  Filled 2015-12-03 (×12): qty 1

## 2015-12-03 MED ORDER — LATANOPROST 0.005 % OP SOLN
1.0000 [drp] | Freq: Every day | OPHTHALMIC | Status: DC
  Administered 2015-12-03 – 2015-12-06 (×4): 1 [drp] via OPHTHALMIC
  Filled 2015-12-03: qty 2.5

## 2015-12-03 MED ORDER — DEXTROSE 5 % IV SOLN: 1.0000 g | INTRAVENOUS | Status: DC

## 2015-12-03 MED ORDER — CLOPIDOGREL BISULFATE 75 MG PO TABS
75.0000 mg | ORAL_TABLET | Freq: Every day | ORAL | Status: DC
  Administered 2015-12-04 – 2015-12-06 (×3): 75 mg via ORAL
  Filled 2015-12-03 (×4): qty 1

## 2015-12-03 MED ORDER — PANTOPRAZOLE SODIUM 40 MG PO TBEC
40.0000 mg | DELAYED_RELEASE_TABLET | Freq: Two times a day (BID) | ORAL | Status: DC
  Administered 2015-12-03 – 2015-12-06 (×8): 40 mg via ORAL
  Filled 2015-12-03 (×8): qty 1

## 2015-12-03 MED ORDER — SODIUM CHLORIDE 0.9% FLUSH
3.0000 mL | Freq: Two times a day (BID) | INTRAVENOUS | Status: DC
  Administered 2015-12-03 – 2015-12-06 (×7): 3 mL via INTRAVENOUS

## 2015-12-03 MED ORDER — ACETAMINOPHEN 650 MG RE SUPP: 650.0000 mg | Freq: Four times a day (QID) | RECTAL | Status: DC | PRN

## 2015-12-03 MED ORDER — ASPIRIN 81 MG PO CHEW: 81.0000 mg | CHEWABLE_TABLET | Freq: Every day | ORAL | Status: DC

## 2015-12-03 MED ORDER — AMLODIPINE BESYLATE 5 MG PO TABS
5.0000 mg | ORAL_TABLET | Freq: Every day | ORAL | Status: DC
  Administered 2015-12-04 – 2015-12-06 (×3): 5 mg via ORAL
  Filled 2015-12-03 (×5): qty 1

## 2015-12-03 MED ORDER — DEXTROSE 5 % IV SOLN
500.0000 mg | INTRAVENOUS | Status: DC
  Administered 2015-12-04: 500 mg via INTRAVENOUS
  Filled 2015-12-03 (×3): qty 500

## 2015-12-03 MED ORDER — SODIUM CHLORIDE 0.9 % IV SOLN
INTRAVENOUS | Status: DC
  Administered 2015-12-03: 15:00:00 via INTRAVENOUS

## 2015-12-03 MED ORDER — DEXTROSE 5 % IV SOLN
1.0000 g | INTRAVENOUS | Status: DC
  Administered 2015-12-04 – 2015-12-06 (×3): 1 g via INTRAVENOUS
  Filled 2015-12-03 (×5): qty 10

## 2015-12-03 MED ORDER — ONDANSETRON HCL 4 MG/2ML IJ SOLN: 4.0000 mg | Freq: Four times a day (QID) | INTRAMUSCULAR | Status: DC | PRN

## 2015-12-03 MED ORDER — DEXTROSE 5 % IV SOLN
500.0000 mg | Freq: Once | INTRAVENOUS | Status: AC
  Administered 2015-12-03: 500 mg via INTRAVENOUS
  Filled 2015-12-03: qty 500

## 2015-12-03 MED ORDER — DEXTROSE 5 % IV SOLN
1.0000 g | Freq: Once | INTRAVENOUS | Status: AC
  Administered 2015-12-03: 1 g via INTRAVENOUS
  Filled 2015-12-03: qty 10

## 2015-12-03 NOTE — ED Triage Notes (Addendum)
Family reports increased confusion and generalized weakness noted yesterday morning. PT alert to place, person and disoriented to time. PT also c/o nausea starting at this time and vomited x1 while being triaged.

## 2015-12-03 NOTE — Progress Notes (Signed)
Pharmacy Antibiotic Note  Tracey Walsh is a 82 y.o. female admitted on 12/03/2015 with pneumonia.  Pharmacy has been consulted for Ceftriaxone and Azithromycin dosing.  Plan: Ceftriaxone 1gm IV q24hrs Azithromycin 500mg  IV q24hrs Deescalate antibiotics as indicate  Height: 5' (152.4 cm) Weight: 143 lb (64.9 kg) IBW/kg (Calculated) : 45.5  Temp (24hrs), Avg:98 F (36.7 C), Min:98 F (36.7 C), Max:98 F (36.7 C)   Recent Labs Lab 12/03/15 1023  WBC 6.5  CREATININE 1.12*    Estimated Creatinine Clearance: 28.7 mL/min (by C-G formula based on SCr of 1.12 mg/dL (H)).    No Known Allergies  Antimicrobials this admission: Ceftriaxone 11/17 >>  Azithromycin 11/17 >>   Dose adjustments this admission: n/a  Microbiology results: None available currently  Thank you for allowing pharmacy to be a part of this patient's care.  Tracey Walsh, BS Loura Backharm D, [place] Clinical Pharmacist Pager [phone] 12/03/2015 12:41 PM

## 2015-12-03 NOTE — ED Notes (Signed)
Lab at bedside, MD told lab blood cultures not needed.

## 2015-12-03 NOTE — ED Notes (Signed)
Patient transported to MRI 

## 2015-12-03 NOTE — ED Provider Notes (Signed)
Emergency Department Provider Note   I have reviewed the triage vital signs and the nursing notes.  By signing my name below, I, [name], attest that this documentation has been prepared under the direction and in the presence of Maia Plan, MD. Electronically Signed: [name], ED Scribe. 12/03/15. 10:15 AM.   HISTORY  Chief Complaint Altered Mental Status  HPI HPI Comments: Tracey Walsh is a 82 y.o. female with a h/o HTN and TIA who presents to the Emergency Department due to confusion x 1 day. Her family states she states she has been acting increasingly confused for the past day which mildly alleviated yesterday afternoon. Her relative then states this morning she was not clearly answering questions and decided to bring her to the ED for evaluation. Upon arriving in the ED she began experiencing nausea and 1x vomiting. Pt denies current nausea. She has a h/o UTI. No h/o dementia. She ambulates with a walker at baseline and states she has been ambulating normally. She denies any regions of pain, CP, SOB, dysuria, fevers, chills and weakness.   Past Medical History:  Diagnosis Date  . Glaucoma   . HTN (hypertension)   . Transient ischemic attack (TIA)     Patient Active Problem List   Diagnosis Date Noted  . PNA (pneumonia) 12/03/2015  . Encephalopathy 12/03/2015  . Transient ischemic attack (TIA)   . TIA (transient ischemic attack) 10/07/2012  . Dysphasia 10/07/2012  . HTN (hypertension) 10/07/2012    Past Surgical History:  Procedure Laterality Date  . None        Allergies Patient has no known allergies.  Family History  Problem Relation Age of Onset  . Hypertension Father   . Diabetes Mother     Social History Social History  Substance Use Topics  . Smoking status: Never Smoker  . Smokeless tobacco: Never Used  . Alcohol use No    Review of Systems Constitutional: No fever/chills Eyes: No visual changes. ENT: No sore  throat. Cardiovascular: Denies chest pain. Respiratory: Denies shortness of breath. Gastrointestinal: No abdominal pain.  Positive nausea and vomiting.  No diarrhea.  No constipation. Genitourinary: Negative for dysuria. Musculoskeletal: Negative for back pain. Skin: Negative for rash. Neurological: Negative for headaches, focal weakness or numbness.  10-point ROS otherwise negative.  ____________________________________________   PHYSICAL EXAM:  VITAL SIGNS: Temp: 98 F RR: 18 SpO2: 96% Pulse: 75 BP: 159/83  Constitutional: Alert and oriented to person and place only. No acute distress.  Eyes: Conjunctivae are normal. PERRL. EOMI. Head: Atraumatic. Nose: No congestion/rhinnorhea. Mouth/Throat: Mucous membranes are moist.  Oropharynx non-erythematous. Neck: No stridor.   Cardiovascular: Normal rate, regular rhythm. Good peripheral circulation. Grossly normal heart sounds.   Respiratory: Normal respiratory effort.  No retractions. Lungs CTAB. Gastrointestinal: Soft and nontender. No distention.  Musculoskeletal: No lower extremity tenderness nor edema. No gross deformities of extremities. Neurologic:  Normal speech and language. No gross focal neurologic deficits are appreciated.  Skin:  Skin is warm, dry and intact. No rash noted.  ____________________________________________   LABS (all labs ordered are listed, but only abnormal results are displayed)  Labs Reviewed  COMPREHENSIVE METABOLIC PANEL - Abnormal; Notable for the following:       Result Value   Glucose, Bld 115 (*)    Creatinine, Ser 1.12 (*)    ALT 13 (*)    GFR calc non Af Amer 42 (*)    GFR calc Af Amer 49 (*)    All  other components within normal limits  CBC - Abnormal; Notable for the following:    Hemoglobin 15.1 (*)    All other components within normal limits  URINALYSIS, ROUTINE W REFLEX MICROSCOPIC (NOT [facility]) - Abnormal; Notable for the following:    APPearance CLOUDY (*)    Leukocytes,  UA TRACE (*)    All other components within normal limits  URINE MICROSCOPIC-ADD ON - Abnormal; Notable for the following:    Squamous Epithelial / LPF TOO NUMEROUS TO COUNT (*)    Bacteria, UA MANY (*)    All other components within normal limits  CULTURE, EXPECTORATED SPUTUM-ASSESSMENT  CBG MONITORING, ED   ____________________________________________  EKG   EKG Interpretation  Date/Time:  Friday December 03 2015 10:07:32 EST Ventricular Rate:  75 PR Interval:    QRS Duration: 86 QT Interval:  383 QTC Calculation: 428 R Axis:   62 Text Interpretation:  Sinus rhythm Consider left atrial enlargement No STEMI. Similar to prior.  Confirmed by LONG MD, JOSHUA [phone]) on 12/03/2015 10:20:25 AM       ____________________________________________  RADIOLOGY  Dg Chest 2 View  Result Date: 12/03/2015 CLINICAL DATA:  Intermittent cough with nausea and vomiting today. EXAM: CHEST  2 VIEW COMPARISON:  Chest x-ray of October 19, 2015 and November 28, 2012. FINDINGS: The lungs are well-expanded. There is increased density overlying the cardiac silhouette which is likely in the lingula. The heart and pulmonary vascularity are normal. There is calcification in the wall of the aortic arch. There is no pleural effusion or pneumothorax. There is mild dextrocurvature centered at the thoracolumbar junction. IMPRESSION: COPD. Lingular atelectasis or pneumonia. Followup PA and lateral chest X-ray is recommended in 3-4 weeks following trial of antibiotic therapy to ensure resolution and exclude underlying malignancy. Thoracic aortic atherosclerosis. Electronically Signed   By: David  SwazilandJordan M.D.   On: 12/03/2015 10:53   Dg Abd 1 View  Result Date: 12/03/2015 CLINICAL DATA:  Nausea and vomiting x3 today. EXAM: ABDOMEN - 1 VIEW COMPARISON:  None. FINDINGS: Single supine view the abdomen and pelvis. Non-obstructive bowel gas pattern. No abnormal abdominal calcifications. No appendicolith. Moderate amount  of stool in the rectum. Mild osteopenia. IMPRESSION: No acute findings. Electronically Signed   By: Jeronimo GreavesKyle  Talbot M.D.   On: 12/03/2015 13:43   Mr Brain Wo Contrast  Result Date: 12/03/2015 CLINICAL DATA:  82 y/o F; increasing confusion, weakness, speech disturbance. EXAM: MRI HEAD WITHOUT CONTRAST TECHNIQUE: Multiplanar, multiecho pulse sequences of the brain and surrounding structures were obtained without intravenous contrast. COMPARISON:  10/08/2012 MRI head. FINDINGS: Brain: No acute infarction, hemorrhage, hydrocephalus, extra-axial collection or mass lesion. Nonspecific foci of T2 FLAIR hyperintense signal abnormality in periventricular and subcortical white matter are consistent with mild chronic microvascular ischemic changes. Mild parenchymal volume loss. Small left cerebellar hemisphere chronic lacunar infarct. Right pontomesencephalic stable focus of susceptibility hypointensity compatible with hemosiderin deposition from old microhemorrhage. Vascular: Normal flow voids. Skull and upper cervical spine: Normal marrow signal. Sinuses/Orbits: Partial left mastoid opacification. Mild paranasal sinus mucosal thickening. Orbits are unremarkable. Other: None. IMPRESSION: 1. No acute intracranial abnormality. 2. Mild stable chronic microvascular ischemic changes and parenchymal volume loss for age. 3. Mild paranasal sinus disease. Electronically Signed   By: Mitzi HansenLance  Furusawa-Stratton M.D.   On: 12/03/2015 13:39   __________________________________________   PROCEDURES  Procedure(s) performed:   Procedures  None ____________________________________________   INITIAL IMPRESSION / ASSESSMENT AND PLAN / ED COURSE  Pertinent labs & imaging results that were available  during my care of the patient were reviewed by me and considered in my medical decision making (see chart for details).  Patient presents to the ED with waxing and waning AMS consistent with acute delirium. She is actively vomiting  on my initial evaluation which is a new symptom. No fever or other abnormal vital signs. Considered atypical ACS presentation but feel this is less likely. Had similar symptoms with prior TIA. She is indorsing left sided chest pain and cough. Also considering UTI. Will obtain CXR, labs, and UA.  12:22 PM Patient with continued vomiting in the emergency department. Her chest x-ray shows a area of possible small pneumonia. Patient does have a small amount of discomfort on that side. Plan to cover for community-acquired pneumonia. The fact that she continues to vomit and family endorses that she is altered especially at home I'll page the hospitalist to discuss admission as she is moderate risk for PNA on CURB-65.   Discussed patient's case with hospitlaist, Dr. Thedore MinsSingh.  Recommend admission to telemetry, observation bed.  I will place holding orders per their request. Patient and family (if present) updated with plan. Care transferred to hospitlaist service. Have ordered MRI brain after discussion with hospitalist.   I reviewed all nursing notes, vitals, pertinent old records, EKGs, labs, imaging (as available).   I personally performed the services described in this documentation, which was scribed in my presence. The recorded information has been reviewed and is accurate.     ____________________________________________  FINAL CLINICAL IMPRESSION(S) / ED DIAGNOSES  Final diagnoses:  Altered mental status, unspecified altered mental status type  Non-intractable vomiting with nausea, unspecified vomiting type     MEDICATIONS GIVEN DURING THIS VISIT:  Medications  amLODipine (NORVASC) tablet 5 mg (5 mg Oral Not Given 12/03/15 1515)  ALPRAZolam (XANAX) tablet 0.5 mg (not administered)  clopidogrel (PLAVIX) tablet 75 mg (75 mg Oral Not Given 12/03/15 1515)  pantoprazole (PROTONIX) EC tablet 40 mg (40 mg Oral Given 12/03/15 1515)  latanoprost (XALATAN) 0.005 % ophthalmic solution 1 drop (not  administered)  ondansetron (ZOFRAN) tablet 4 mg (not administered)    Or  ondansetron (ZOFRAN) injection 4 mg (not administered)  heparin injection 5,000 Units (5,000 Units Subcutaneous Given 12/03/15 1515)  sodium chloride flush (NS) 0.9 % injection 3 mL (3 mLs Intravenous Not Given 12/03/15 1430)  acetaminophen (TYLENOL) tablet 650 mg (not administered)    Or  acetaminophen (TYLENOL) suppository 650 mg (not administered)  cefTRIAXone (ROCEPHIN) 1 g in dextrose 5 % 50 mL IVPB (not administered)  albuterol (PROVENTIL) (2.5 MG/3ML) 0.083% nebulizer solution 2.5 mg (not administered)  0.9 %  sodium chloride infusion ( Intravenous New Bag/Given 12/03/15 1514)  azithromycin (ZITHROMAX) 500 mg in dextrose 5 % 250 mL IVPB (not administered)  cefTRIAXone (ROCEPHIN) 1 g in dextrose 5 % 50 mL IVPB (1 g Intravenous New Bag/Given 12/03/15 1245)  azithromycin (ZITHROMAX) 500 mg in dextrose 5 % 250 mL IVPB (500 mg Intravenous New Bag/Given 12/03/15 1407)     NEW OUTPATIENT MEDICATIONS STARTED DURING THIS VISIT:  None   Note:  This document was prepared using Dragon voice recognition software and may include unintentional dictation errors.  Alona BeneJoshua Long, MD Emergency Medicine    Maia PlanJoshua G Long, MD 12/03/15 [phone]

## 2015-12-03 NOTE — H&P (Signed)
TRH H&P   Patient Demographics:    Tracey Walsh, is a 82 y.o. female  MRN: [id number]   DOB - 1924/03/01  Admit Date - 12/03/2015  Outpatient Primary MD for the patient is Fredirick Maudlin, MD  Patient coming from: Home   Chief Complaint  Patient presents with  . Altered Mental Status      HPI:    Tracey Walsh  is a 82 y.o. female, with a past medical history for hypertension and transient ischemic attack who ambulates by walker and lives alone, presents with complaints of difficulty in finding words that began yesterday. Patient was discharged few days ago for a fall resulting in hitting the left side of her chest wall. She has an  associated cough since the last 2 days. She Does have mild left-sided chest wall pain, no shortness of breath, no fever, or chills. Chest x-ray was significant for ? pneumonia. She was started on empirical antibiotics and admitted for further evaluation of community acquired pneumonia & rule out TIA/CVA.    Review of systems:    In addition to the HPI above,  No Fever-chills, No Headache, No changes with Vision or hearing, No problems swallowing food or Liquids, +ve Chest pain, Shortness of Breath, No Abdominal pain, +ve Mild Nausea , Bowel movements are regular, No Blood in stool or Urine, No dysuria, No new skin rashes or bruises, No new joints pains-aches,  No new weakness, tingling, numbness in any extremity, No recent weight gain or loss, No polyuria, polydypsia or polyphagia, No significant Mental Stressors.  A full 10 point Review of Systems was done, except as stated above, all other Review of Systems were negative.   With Past History of the following :    Past  Medical History:  Diagnosis Date  . Glaucoma   . HTN (hypertension)   . Transient ischemic attack (TIA)       Past Surgical History:  Procedure Laterality Date  . None        Social History:     Social History  Substance Use Topics  . Smoking status: Never Smoker  . Smokeless tobacco: Never Used  . Alcohol use No       Family History :     Family History  Problem Relation Age of Onset  . Hypertension Father   .  Diabetes Mother      Home Medications:   Prior to Admission medications   Medication Sig Start Date End Date Taking? Authorizing Provider  ALPRAZolam (XANAX) 0.5 MG tablet 1 tablet 3 (three) times daily as needed. 08/18/15  Yes Historical Provider, MD  amLODipine (NORVASC) 5 MG tablet 1 tablet daily. 10/18/15  Yes Historical Provider, MD  CALCIUM PO Take 2 tablets by mouth daily.   Yes Historical Provider, MD  clopidogrel (PLAVIX) 75 MG tablet Take 1 tablet (75 mg total) by mouth daily. 10/08/12  Yes Kari BaarsEdward Hawkins, MD  COMBIGAN 0.2-0.5 % ophthalmic solution Apply 1 drop to eye 2 (two) times daily.  07/31/12  Yes Historical Provider, MD  LUMIGAN 0.01 % SOLN Apply 1 drop to eye at bedtime.  10/02/12  Yes Historical Provider, MD  pantoprazole (PROTONIX) 40 MG tablet Take 40 mg by mouth 2 (two) times daily. Prescribed one tablet twice daily 07/31/12  Yes Historical Provider, MD  Probiotic Product (TRUBIOTICS PO) Take 1 tablet by mouth daily.   Yes Historical Provider, MD  HYDROcodone-acetaminophen (NORCO) 5-325 MG tablet Take 1 tablet by mouth every 6 (six) hours as needed for severe pain. 10/19/15   Blane OharaJoshua Zavitz, MD     Allergies:    No Known Allergies   Physical Exam:   Vitals  Blood pressure 155/73, pulse 77, temperature 98 F (36.7 C), temperature source Oral, resp. rate 22, height 5' (1.524 m), weight 64.9 kg (143 lb), SpO2 95 %.   1. General 82 year-old female lying in bed in NAD.  2. Normal affect and insight, Not Suicidal or Homicidal, Awake Alert,  Oriented X 3.  3. No F.N deficits, ALL C.Nerves Intact, Strength 5/5 all 4 extremities, Sensation intact all 4 extremities, Plantars down going.  4. Ears and Eyes appear Normal, Conjunctivae clear, PERRLA. Moist Oral Mucosa.  5. Supple Neck, No JVD, No cervical lymphadenopathy appriciated, No Carotid Bruits.  6. Symmetrical Chest wall movement, Good air movement bilaterally, CTAB.  7. RRR, No Gallops, Rubs or Murmurs, No Parasternal Heave.  8. Positive Bowel Sounds, Abdomen Soft, No tenderness, No organomegaly appriciated,No rebound -guarding or rigidity.  9.  No Cyanosis, Normal Skin Turgor, No Skin Rash or Bruise.  10. Good muscle tone,  joints appear normal , no effusions, Normal ROM.  11. No Palpable Lymph Nodes in Neck or Axillae   Data Review:    CBC  Recent Labs Lab 12/03/15 1023  WBC 6.5  HGB 15.1*  HCT 44.6  PLT 231  MCV 94.9  MCH 32.1  MCHC 33.9  RDW 12.9   ------------------------------------------------------------------------------------------------------------------  Chemistries   Recent Labs Lab 12/03/15 1023  NA 139  K 3.8  CL 104  CO2 28  GLUCOSE 115*  BUN 17  CREATININE 1.12*  CALCIUM 9.5  AST 21  ALT 13*  ALKPHOS 69  BILITOT 0.9   ------------------------------------------------------------------------------------------------------------------ estimated creatinine clearance is 28.7 mL/min (by C-G formula based on SCr of 1.12 mg/dL (H)). ------------------------------------------------------------------------------------------------------------------ No results for input(s): TSH, T4TOTAL, T3FREE, THYROIDAB in the last 72 hours.  Invalid input(s): FREET3  Coagulation profile No results for input(s): INR, PROTIME in the last 168 hours. ------------------------------------------------------------------------------------------------------------------- No results for input(s): DDIMER in the last 72  hours. -------------------------------------------------------------------------------------------------------------------  Cardiac Enzymes No results for input(s): CKMB, TROPONINI, MYOGLOBIN in the last 168 hours.  Invalid input(s): CK ------------------------------------------------------------------------------------------------------------------ No results found for: BNP   ---------------------------------------------------------------------------------------------------------------  Urinalysis    Component Value Date/Time   COLORURINE YELLOW 12/03/2015 1056   APPEARANCEUR CLOUDY (  A) 12/03/2015 1056   LABSPEC 1.015 12/03/2015 1056   PHURINE 5.5 12/03/2015 1056   GLUCOSEU NEGATIVE 12/03/2015 1056   HGBUR NEGATIVE 12/03/2015 1056   BILIRUBINUR NEGATIVE 12/03/2015 1056   KETONESUR NEGATIVE 12/03/2015 1056   PROTEINUR NEGATIVE 12/03/2015 1056   UROBILINOGEN 0.2 10/07/2012 1510   NITRITE NEGATIVE 12/03/2015 1056   LEUKOCYTESUR TRACE (A) 12/03/2015 1056    ----------------------------------------------------------------------------------------------------------------   Imaging Results:    Dg Chest 2 View  Result Date: 12/03/2015 CLINICAL DATA:  Intermittent cough with nausea and vomiting today. EXAM: CHEST  2 VIEW COMPARISON:  Chest x-ray of October 19, 2015 and November 28, 2012. FINDINGS: The lungs are well-expanded. There is increased density overlying the cardiac silhouette which is likely in the lingula. The heart and pulmonary vascularity are normal. There is calcification in the wall of the aortic arch. There is no pleural effusion or pneumothorax. There is mild dextrocurvature centered at the thoracolumbar junction. IMPRESSION: COPD. Lingular atelectasis or pneumonia. Followup PA and lateral chest X-ray is recommended in 3-4 weeks following trial of antibiotic therapy to ensure resolution and exclude underlying malignancy. Thoracic aortic atherosclerosis. Electronically  Signed   By: David  SwazilandJordan M.D.   On: 12/03/2015 10:53    My personal review of EKG: Rhythm NSR   Assessment & Plan:     1. Possible CAP. Patient presented with a 2 day history of confusion. Chest x-ray is notable for ? pneumonia. Will follow cultures and continue pulmonary hygiene and empirical antibiotics Zithromax and Rocephin. Will add IS as asked wall related pain could have resulted in atelectasis and pneumonia  2. Hx of TIA. Continue on Plavix, rule out another neurological event by checking MRI brain. Will have PT and speech see her as well. Note speech currently is back to baseline.   3. Hypertension. Pressures are elevated. Continue amlodipine and monitor.  4. Glaucoma. Continue home medications.   5. Mild underlying anxiety. Continue Xanax when necessary at lower than home dose.  6.Mild AKI - gentle IVF.    DVT Prophylaxis Heparin  AM Labs Ordered, also please review Full Orders  Family Communication: Admission, patients condition and plan of care including tests being ordered have been discussed with the patient, family, and friend who indicate understanding and agree with the plan and Code Status. Her daughters live out of town [place] and the other lives in [place].   Code Status DNR   Likely DC to  Home in 1-2 days  Condition GUARDED    Consults called: None   Admission status: Obs  Time spent in minutes : 35 minutes    Annett GulaHailei N Fulton M.D on 12/03/2015 at 12:34 PM  Between 7am to 7pm - Pager - [phone]. After 7pm go to [email or web address] - password [email or web address]  [facility] - Office  [phone]     By signing my name below, I, Cynda AcresHailei Fulton, attest that this documentation has been prepared under the direction and in the presence of Susa RaringPrashant, Kymari Lollis , MD. Electronically signed: Cynda AcresHailei Fulton, Scribe. 12/03/15

## 2015-12-03 NOTE — Evaluation (Signed)
Speech Language Pathology Evaluation Patient Details Name: Tracey Walsh MRN: [id number] DOB: 12-Mar-1924 Today's Date: 12/03/2015 Time: [id number] SLP Time Calculation (min) (ACUTE ONLY): 29 min  Problem List:  Patient Active Problem List   Diagnosis Date Noted  . PNA (pneumonia) 12/03/2015  . Encephalopathy 12/03/2015  . Transient ischemic attack (TIA)   . TIA (transient ischemic attack) 10/07/2012  . Dysphasia 10/07/2012  . HTN (hypertension) 10/07/2012   Past Medical History:  Past Medical History:  Diagnosis Date  . Glaucoma   . HTN (hypertension)   . Transient ischemic attack (TIA)    Past Surgical History:  Past Surgical History:  Procedure Laterality Date  . None     HPI:  AileenDraperis a 82 y.o.female,with a past medical history for hypertension and transient ischemic attack who ambulates by walker and livesalone, presents with complaints of difficulty in finding words that began yesterday. Patient was discharged few daysago for a fall resulting inhitting the left side of her chest wall. She has an associated cough since the last 2 days. She Does have mild left-sidedchest wall pain, no shortness of breath, nofever, or chills. Chest x-ray was significant for lingular atelectasis or pneumonia. She was started on empirical antibiotics and admitted for further evaluation of community acquired pneumonia & rule out TIA/CVA.    Assessment / Plan / Recommendation Clinical Impression  Pt was reportedly presenting with expressive aphasia yesterday and this am; family friend at bedside reports pt understood questions and was fluently responding but the "wrong words were coming out". This evidently happened on and off yesterday and again this morning before pt was brought to the ED. SLP provided a cognitive/speech screen on which pt's speech was completely [facility]. Pt provided a full and accurate history including bioraphical information; she furthermore answered y/n questions  accurately and explained multi-step processes with no expressive or receptive difficulty. Pt was negative screened for dysphagia consuming liquids in the room presenting with no overt s/sx of aspiration. All presenting symptoms have resolved. There are no further ST needs at this time. ST to sign off.     SLP Assessment  Patient does not need any further Speech [facility]           HilbertAmelia H. Romie LeveeYarbrough MA, CCC-SLP Speech Language Pathologist                    Georgetta Habermelia H Pascale Maves 12/03/2015, 5:12 PM

## 2015-12-04 DIAGNOSIS — J189 Pneumonia, unspecified organism: Secondary | ICD-10-CM | POA: Diagnosis present

## 2015-12-04 DIAGNOSIS — G9341 Metabolic encephalopathy: Secondary | ICD-10-CM | POA: Diagnosis not present

## 2015-12-04 DIAGNOSIS — I10 Essential (primary) hypertension: Secondary | ICD-10-CM | POA: Diagnosis not present

## 2015-12-04 DIAGNOSIS — Z7902 Long term (current) use of antithrombotics/antiplatelets: Secondary | ICD-10-CM | POA: Diagnosis not present

## 2015-12-04 DIAGNOSIS — N179 Acute kidney failure, unspecified: Secondary | ICD-10-CM | POA: Diagnosis not present

## 2015-12-04 DIAGNOSIS — Z8673 Personal history of transient ischemic attack (TIA), and cerebral infarction without residual deficits: Secondary | ICD-10-CM | POA: Diagnosis not present

## 2015-12-04 DIAGNOSIS — Z8249 Family history of ischemic heart disease and other diseases of the circulatory system: Secondary | ICD-10-CM | POA: Diagnosis not present

## 2015-12-04 DIAGNOSIS — Z66 Do not resuscitate: Secondary | ICD-10-CM | POA: Diagnosis not present

## 2015-12-04 DIAGNOSIS — Z79899 Other long term (current) drug therapy: Secondary | ICD-10-CM | POA: Diagnosis not present

## 2015-12-04 DIAGNOSIS — H409 Unspecified glaucoma: Secondary | ICD-10-CM | POA: Diagnosis not present

## 2015-12-04 DIAGNOSIS — Z833 Family history of diabetes mellitus: Secondary | ICD-10-CM | POA: Diagnosis not present

## 2015-12-04 DIAGNOSIS — F419 Anxiety disorder, unspecified: Secondary | ICD-10-CM | POA: Diagnosis not present

## 2015-12-04 DIAGNOSIS — R4182 Altered mental status, unspecified: Secondary | ICD-10-CM | POA: Diagnosis not present

## 2015-12-04 LAB — BASIC METABOLIC PANEL
Anion gap: 6 (ref 5–15)
BUN: 15 mg/dL (ref 6–20)
CHLORIDE: 107 mmol/L (ref 101–111)
CO2: 25 mmol/L (ref 22–32)
Calcium: 8.7 mg/dL — ABNORMAL LOW (ref 8.9–10.3)
Creatinine, Ser: 1.06 mg/dL — ABNORMAL HIGH (ref 0.44–1.00)
GFR calc Af Amer: 52 mL/min — ABNORMAL LOW (ref >60)
GFR calc non Af Amer: 45 mL/min — ABNORMAL LOW (ref >60)
GLUCOSE: 97 mg/dL (ref 65–99)
POTASSIUM: 3.4 mmol/L — AB (ref 3.5–5.1)
Sodium: 138 mmol/L (ref 135–145)

## 2015-12-04 LAB — CBC
HEMATOCRIT: 38.7 % (ref 36.0–46.0)
HEMOGLOBIN: 12.8 g/dL (ref 12.0–15.0)
MCH: 31.4 pg (ref 26.0–34.0)
MCHC: 33.1 g/dL (ref 30.0–36.0)
MCV: 94.9 fL (ref 78.0–100.0)
Platelets: 202 10*3/uL (ref 150–400)
RBC: 4.08 MIL/uL (ref 3.87–5.11)
RDW: 12.9 % (ref 11.5–15.5)
WBC: 7.1 10*3/uL (ref 4.0–10.5)

## 2015-12-04 NOTE — Progress Notes (Signed)
Notified Dr. Felecia ShellingFanta, on call for Dr Juanetta GoslingHawkins today, this morning of IV fluid order for NS at 75 ml/hr expired. Stated to hold IV fluids for now since patient eating and drinking. Earnstine RegalAshley Gaines Cartmell, RN

## 2015-12-04 NOTE — Progress Notes (Signed)
Subjective: She was admitted with altered mental status. She had significant speech abnormality. She appears to have pneumonia on chest x-ray. She may have a urinary tract infection. She is back to baseline now as far as her mental status and speech  Objective: Vital signs in last 24 hours: Temp:  [98 F (36.7 C)-98.6 F (37 C)] 98 F (36.7 C) (11/18 0559) Pulse Rate:  [59-84] 59 (11/18 0559) Resp:  [18-23] 20 (11/18 0559) BP: (116-160)/(48-83) 116/53 (11/18 0559) SpO2:  [94 %-98 %] 94 % (11/18 0559) Weight:  [64.9 kg (143 lb)-66.4 kg (146 lb 6.4 oz)] 66 kg (145 lb 9 oz) (11/18 0500) Weight change:  Last BM Date: 12/02/15  Intake/Output from previous day: 11/17 0701 - 11/18 0700 In: 450.5 [P.O.:240; I.V.:210.5] Out: 200 [Urine:200]  PHYSICAL EXAM General appearance: alert, cooperative and no distress Resp: She has rhonchi on the left lung Cardio: regular rate and rhythm, S1, S2 normal, no murmur, click, rub or gallop GI: soft, non-tender; bowel sounds normal; no masses,  no organomegaly Extremities: extremities normal, atraumatic, no cyanosis or edema Skin warm and dry  Lab Results:  Results for orders placed or performed during the hospital encounter of 12/03/15 (from the past 48 hour(s))  Comprehensive metabolic panel     Status: Abnormal   Collection Time: 12/03/15 10:23 AM  Result Value Ref Range   Sodium 139 135 - 145 mmol/L   Potassium 3.8 3.5 - 5.1 mmol/L   Chloride 104 101 - 111 mmol/L   CO2 28 22 - 32 mmol/L   Glucose, Bld 115 (H) 65 - 99 mg/dL   BUN 17 6 - 20 mg/dL   Creatinine, Ser 1.12 (H) 0.44 - 1.00 mg/dL   Calcium 9.5 8.9 - 10.3 mg/dL   Total Protein 7.4 6.5 - 8.1 g/dL   Albumin 4.3 3.5 - 5.0 g/dL   AST 21 15 - 41 U/L   ALT 13 (L) 14 - 54 U/L   Alkaline Phosphatase 69 38 - 126 U/L   Total Bilirubin 0.9 0.3 - 1.2 mg/dL   GFR calc non Af Amer 42 (L) >60 mL/min   GFR calc Af Amer 49 (L) >60 mL/min    Comment: (NOTE) The eGFR has been calculated using  the CKD EPI equation. This calculation has not been validated in all clinical situations. eGFR's persistently <60 mL/min signify possible Chronic Kidney Disease.    Anion gap 7 5 - 15  CBC     Status: Abnormal   Collection Time: 12/03/15 10:23 AM  Result Value Ref Range   WBC 6.5 4.0 - 10.5 K/uL   RBC 4.70 3.87 - 5.11 MIL/uL   Hemoglobin 15.1 (H) 12.0 - 15.0 g/dL   HCT 44.6 36.0 - 46.0 %   MCV 94.9 78.0 - 100.0 fL   MCH 32.1 26.0 - 34.0 pg   MCHC 33.9 30.0 - 36.0 g/dL   RDW 12.9 11.5 - 15.5 %   Platelets 231 150 - 400 K/uL  CBG monitoring, ED     Status: None   Collection Time: 12/03/15 10:28 AM  Result Value Ref Range   Glucose-Capillary 92 65 - 99 mg/dL  Urinalysis, Routine w reflex microscopic     Status: Abnormal   Collection Time: 12/03/15 10:56 AM  Result Value Ref Range   Color, Urine YELLOW YELLOW   APPearance CLOUDY (A) CLEAR   Specific Gravity, Urine 1.015 1.005 - 1.030   pH 5.5 5.0 - 8.0   Glucose, UA NEGATIVE NEGATIVE mg/dL  Hgb urine dipstick NEGATIVE NEGATIVE   Bilirubin Urine NEGATIVE NEGATIVE   Ketones, ur NEGATIVE NEGATIVE mg/dL   Protein, ur NEGATIVE NEGATIVE mg/dL   Nitrite NEGATIVE NEGATIVE   Leukocytes, UA TRACE (A) NEGATIVE  Urine microscopic-add on     Status: Abnormal   Collection Time: 12/03/15 10:56 AM  Result Value Ref Range   Squamous Epithelial / LPF TOO NUMEROUS TO COUNT (A) NONE SEEN   WBC, UA 0-5 0 - 5 WBC/hpf   RBC / HPF 0-5 0 - 5 RBC/hpf   Bacteria, UA MANY (A) NONE SEEN    ABGS No results for input(s): PHART, PO2ART, TCO2, HCO3 in the last 72 hours.  Invalid input(s): PCO2 CULTURES No results found for this or any previous visit (from the past 240 hour(s)). Studies/Results: Dg Chest 2 View  Result Date: 12/03/2015 CLINICAL DATA:  Intermittent cough with nausea and vomiting today. EXAM: CHEST  2 VIEW COMPARISON:  Chest x-ray of October 19, 2015 and November 28, 2012. FINDINGS: The lungs are well-expanded. There is increased  density overlying the cardiac silhouette which is likely in the lingula. The heart and pulmonary vascularity are normal. There is calcification in the wall of the aortic arch. There is no pleural effusion or pneumothorax. There is mild dextrocurvature centered at the thoracolumbar junction. IMPRESSION: COPD. Lingular atelectasis or pneumonia. Followup PA and lateral chest X-ray is recommended in 3-4 weeks following trial of antibiotic therapy to ensure resolution and exclude underlying malignancy. Thoracic aortic atherosclerosis. Electronically Signed   By: David  Martinique M.D.   On: 12/03/2015 10:53   Dg Abd 1 View  Result Date: 12/03/2015 CLINICAL DATA:  Nausea and vomiting x3 today. EXAM: ABDOMEN - 1 VIEW COMPARISON:  None. FINDINGS: Single supine view the abdomen and pelvis. Non-obstructive bowel gas pattern. No abnormal abdominal calcifications. No appendicolith. Moderate amount of stool in the rectum. Mild osteopenia. IMPRESSION: No acute findings. Electronically Signed   By: Abigail Miyamoto M.D.   On: 12/03/2015 13:43   Mr Brain Wo Contrast  Result Date: 12/03/2015 CLINICAL DATA:  82 y/o F; increasing confusion, weakness, speech disturbance. EXAM: MRI HEAD WITHOUT CONTRAST TECHNIQUE: Multiplanar, multiecho pulse sequences of the brain and surrounding structures were obtained without intravenous contrast. COMPARISON:  10/08/2012 MRI head. FINDINGS: Brain: No acute infarction, hemorrhage, hydrocephalus, extra-axial collection or mass lesion. Nonspecific foci of T2 FLAIR hyperintense signal abnormality in periventricular and subcortical white matter are consistent with mild chronic microvascular ischemic changes. Mild parenchymal volume loss. Small left cerebellar hemisphere chronic lacunar infarct. Right pontomesencephalic stable focus of susceptibility hypointensity compatible with hemosiderin deposition from old microhemorrhage. Vascular: Normal flow voids. Skull and upper cervical spine: Normal marrow  signal. Sinuses/Orbits: Partial left mastoid opacification. Mild paranasal sinus mucosal thickening. Orbits are unremarkable. Other: None. IMPRESSION: 1. No acute intracranial abnormality. 2. Mild stable chronic microvascular ischemic changes and parenchymal volume loss for age. 3. Mild paranasal sinus disease. Electronically Signed   By: Kristine Garbe M.D.   On: 12/03/2015 13:39    Medications:  Prior to Admission:  Prescriptions Prior to Admission  Medication Sig Dispense Refill Last Dose  . ALPRAZolam (XANAX) 0.5 MG tablet 1 tablet 3 (three) times daily as needed.   12/02/2015 at Unknown time  . amLODipine (NORVASC) 5 MG tablet 1 tablet daily.   12/03/2015 at Unknown time  . CALCIUM PO Take 2 tablets by mouth daily.   12/02/2015 at Unknown time  . clopidogrel (PLAVIX) 75 MG tablet Take 1 tablet (75 mg total) by  mouth daily. 30 tablet 12 12/03/2015 at Unknown time  . COMBIGAN 0.2-0.5 % ophthalmic solution Apply 1 drop to eye 2 (two) times daily.    12/02/2015 at Unknown time  . LUMIGAN 0.01 % SOLN Apply 1 drop to eye at bedtime.    12/02/2015 at Unknown time  . pantoprazole (PROTONIX) 40 MG tablet Take 40 mg by mouth 2 (two) times daily. Prescribed one tablet twice daily   12/02/2015 at Unknown time  . Probiotic Product (TRUBIOTICS PO) Take 1 tablet by mouth daily.   12/03/2015 at Unknown time  . HYDROcodone-acetaminophen (NORCO) 5-325 MG tablet Take 1 tablet by mouth every 6 (six) hours as needed for severe pain. 5 tablet 0    Scheduled: . amLODipine  5 mg Oral Daily  . azithromycin  500 mg Intravenous Q24H  . cefTRIAXone (ROCEPHIN)  IV  1 g Intravenous Q24H  . clopidogrel  75 mg Oral Daily  . heparin  5,000 Units Subcutaneous Q8H  . latanoprost  1 drop Both Eyes QHS  . pantoprazole  40 mg Oral BID  . sodium chloride flush  3 mL Intravenous Q12H   Continuous:  TOI:[id number] **OR** acetaminophen, albuterol, ALPRAZolam, ondansetron **OR** ondansetron (ZOFRAN)  IV  Assesment: She was admitted with acute encephalopathy likely metabolic from pneumonia. She may have a urinary tract infection as well. She is on treatment for pneumonia. She has improved but she's not ready for discharge Principal Problem:   Encephalopathy Active Problems:   TIA (transient ischemic attack)   HTN (hypertension)   PNA (pneumonia)    Plan: Continue current treatments    LOS: 0 days   Gagandeep Kossman L 12/04/2015, 8:05 AM

## 2015-12-04 NOTE — Plan of Care (Signed)
Problem: Safety: Goal: Ability to remain free from injury will improve Outcome: Progressing Fall prevention, safety instructions discussed with patient. Verbalized understanding. Personal items, phone,call light kept within reach. Instructed to call for assistance and not attempt getting up on her own. Education reinforced as needed. Earnstine RegalAshley Dorna Mallet, RN

## 2015-12-04 NOTE — Progress Notes (Signed)
Nursing called to patient room this afternoon for c/o pain, swelling to IV site. Azithromycin infusing. IV site d/c'd and heat applied per megan bullins, RN. New IV access obtained per T. Corine ShelterWatkins, RN. On follow-up, swelling to site noted, no c/o pain. Nursing supervisor discussed with pharmacy, no further interventions needed. Paged Dr Felecia ShellingFanta to notify, awaiting response. On-coming RN at this time to page again if needed and monitor site. Earnstine RegalAshley Bubber Rothert, RN

## 2015-12-05 MED ORDER — POTASSIUM CHLORIDE CRYS ER 20 MEQ PO TBCR
20.0000 meq | EXTENDED_RELEASE_TABLET | Freq: Two times a day (BID) | ORAL | Status: DC
  Administered 2015-12-05 – 2015-12-06 (×4): 20 meq via ORAL
  Filled 2015-12-05 (×4): qty 1

## 2015-12-05 MED ORDER — BRIMONIDINE TARTRATE 0.2 % OP SOLN
1.0000 [drp] | Freq: Two times a day (BID) | OPHTHALMIC | Status: DC
  Administered 2015-12-06 (×2): 1 [drp] via OPHTHALMIC
  Filled 2015-12-05: qty 5

## 2015-12-05 MED ORDER — AZITHROMYCIN 250 MG PO TABS
500.0000 mg | ORAL_TABLET | Freq: Every day | ORAL | Status: DC
  Administered 2015-12-05 – 2015-12-06 (×2): 500 mg via ORAL
  Filled 2015-12-05 (×2): qty 2

## 2015-12-05 MED ORDER — BRIMONIDINE TARTRATE-TIMOLOL 0.2-0.5 % OP SOLN: 1.0000 [drp] | Freq: Two times a day (BID) | OPHTHALMIC | Status: DC

## 2015-12-05 MED ORDER — TIMOLOL MALEATE 0.5 % OP SOLN
1.0000 [drp] | Freq: Two times a day (BID) | OPHTHALMIC | Status: DC
  Administered 2015-12-05: 1 [drp] via OPHTHALMIC
  Filled 2015-12-05: qty 5

## 2015-12-05 NOTE — Progress Notes (Signed)
Subjective: She was admitted with pneumonia and possible TIA. She feels better. She is still very weak. She does not have any help at home and she lives alone. She denies any nausea or vomiting. She is still coughing. Still some chest pain.  Objective: Vital signs in last 24 hours: Temp:  [98.1 F (36.7 C)-98.9 F (37.2 C)] 98.9 F (37.2 C) (11/19 0347) Pulse Rate:  [56-67] 64 (11/19 0347) Resp:  [18-20] 18 (11/19 0347) BP: (114-149)/(47-56) 149/56 (11/19 0347) SpO2:  [95 %-97 %] 97 % (11/19 0347) Weight:  [65.6 kg (144 lb 10 oz)] 65.6 kg (144 lb 10 oz) (11/19 0347) Weight change: 0.736 kg (1 lb 10 oz) Last BM Date: 12/02/15  Intake/Output from previous day: 11/18 0701 - 11/19 0700 In: 483 [P.O.:480; I.V.:3] Out: 300 [Urine:300]  PHYSICAL EXAM General appearance: alert, cooperative and mild distress Resp: rhonchi Left greater than right Cardio: regular rate and rhythm, S1, S2 normal, no murmur, click, rub or gallop GI: soft, non-tender; bowel sounds normal; no masses,  no organomegaly Extremities: extremities normal, atraumatic, no cyanosis or edema Neurologically intact. Skin warm and dry.  Lab Results:  Results for orders placed or performed during the hospital encounter of 12/03/15 (from the past 48 hour(s))  Urinalysis, Routine w reflex microscopic     Status: Abnormal   Collection Time: 12/03/15 10:56 AM  Result Value Ref Range   Color, Urine YELLOW YELLOW   APPearance CLOUDY (A) CLEAR   Specific Gravity, Urine 1.015 1.005 - 1.030   pH 5.5 5.0 - 8.0   Glucose, UA NEGATIVE NEGATIVE mg/dL   Hgb urine dipstick NEGATIVE NEGATIVE   Bilirubin Urine NEGATIVE NEGATIVE   Ketones, ur NEGATIVE NEGATIVE mg/dL   Protein, ur NEGATIVE NEGATIVE mg/dL   Nitrite NEGATIVE NEGATIVE   Leukocytes, UA TRACE (A) NEGATIVE  Urine microscopic-add on     Status: Abnormal   Collection Time: 12/03/15 10:56 AM  Result Value Ref Range   Squamous Epithelial / LPF TOO NUMEROUS TO COUNT (A) NONE  SEEN   WBC, UA 0-5 0 - 5 WBC/hpf   RBC / HPF 0-5 0 - 5 RBC/hpf   Bacteria, UA MANY (A) NONE SEEN  Basic metabolic panel     Status: Abnormal   Collection Time: 12/04/15  7:01 AM  Result Value Ref Range   Sodium 138 135 - 145 mmol/L   Potassium 3.4 (L) 3.5 - 5.1 mmol/L   Chloride 107 101 - 111 mmol/L   CO2 25 22 - 32 mmol/L   Glucose, Bld 97 65 - 99 mg/dL   BUN 15 6 - 20 mg/dL   Creatinine, Ser 1.06 (H) 0.44 - 1.00 mg/dL   Calcium 8.7 (L) 8.9 - 10.3 mg/dL   GFR calc non Af Amer 45 (L) >60 mL/min   GFR calc Af Amer 52 (L) >60 mL/min    Comment: (NOTE) The eGFR has been calculated using the CKD EPI equation. This calculation has not been validated in all clinical situations. eGFR's persistently <60 mL/min signify possible Chronic Kidney Disease.    Anion gap 6 5 - 15  CBC     Status: None   Collection Time: 12/04/15  7:01 AM  Result Value Ref Range   WBC 7.1 4.0 - 10.5 K/uL   RBC 4.08 3.87 - 5.11 MIL/uL   Hemoglobin 12.8 12.0 - 15.0 g/dL   HCT 38.7 36.0 - 46.0 %   MCV 94.9 78.0 - 100.0 fL   MCH 31.4 26.0 - 34.0 pg  MCHC 33.1 30.0 - 36.0 g/dL   RDW 12.9 11.5 - 15.5 %   Platelets 202 150 - 400 K/uL    ABGS No results for input(s): PHART, PO2ART, TCO2, HCO3 in the last 72 hours.  Invalid input(s): PCO2 CULTURES No results found for this or any previous visit (from the past 240 hour(s)). Studies/Results: Dg Chest 2 View  Result Date: 12/03/2015 CLINICAL DATA:  Intermittent cough with nausea and vomiting today. EXAM: CHEST  2 VIEW COMPARISON:  Chest x-ray of October 19, 2015 and November 28, 2012. FINDINGS: The lungs are well-expanded. There is increased density overlying the cardiac silhouette which is likely in the lingula. The heart and pulmonary vascularity are normal. There is calcification in the wall of the aortic arch. There is no pleural effusion or pneumothorax. There is mild dextrocurvature centered at the thoracolumbar junction. IMPRESSION: COPD. Lingular  atelectasis or pneumonia. Followup PA and lateral chest X-ray is recommended in 3-4 weeks following trial of antibiotic therapy to ensure resolution and exclude underlying malignancy. Thoracic aortic atherosclerosis. Electronically Signed   By: David  Martinique M.D.   On: 12/03/2015 10:53   Dg Abd 1 View  Result Date: 12/03/2015 CLINICAL DATA:  Nausea and vomiting x3 today. EXAM: ABDOMEN - 1 VIEW COMPARISON:  None. FINDINGS: Single supine view the abdomen and pelvis. Non-obstructive bowel gas pattern. No abnormal abdominal calcifications. No appendicolith. Moderate amount of stool in the rectum. Mild osteopenia. IMPRESSION: No acute findings. Electronically Signed   By: Abigail Miyamoto M.D.   On: 12/03/2015 13:43   Mr Brain Wo Contrast  Result Date: 12/03/2015 CLINICAL DATA:  82 y/o F; increasing confusion, weakness, speech disturbance. EXAM: MRI HEAD WITHOUT CONTRAST TECHNIQUE: Multiplanar, multiecho pulse sequences of the brain and surrounding structures were obtained without intravenous contrast. COMPARISON:  10/08/2012 MRI head. FINDINGS: Brain: No acute infarction, hemorrhage, hydrocephalus, extra-axial collection or mass lesion. Nonspecific foci of T2 FLAIR hyperintense signal abnormality in periventricular and subcortical white matter are consistent with mild chronic microvascular ischemic changes. Mild parenchymal volume loss. Small left cerebellar hemisphere chronic lacunar infarct. Right pontomesencephalic stable focus of susceptibility hypointensity compatible with hemosiderin deposition from old microhemorrhage. Vascular: Normal flow voids. Skull and upper cervical spine: Normal marrow signal. Sinuses/Orbits: Partial left mastoid opacification. Mild paranasal sinus mucosal thickening. Orbits are unremarkable. Other: None. IMPRESSION: 1. No acute intracranial abnormality. 2. Mild stable chronic microvascular ischemic changes and parenchymal volume loss for age. 3. Mild paranasal sinus disease.  Electronically Signed   By: Kristine Garbe M.D.   On: 12/03/2015 13:39    Medications:  Prior to Admission:  Prescriptions Prior to Admission  Medication Sig Dispense Refill Last Dose  . ALPRAZolam (XANAX) 0.5 MG tablet 1 tablet 3 (three) times daily as needed.   12/02/2015 at Unknown time  . amLODipine (NORVASC) 5 MG tablet 1 tablet daily.   12/03/2015 at Unknown time  . CALCIUM PO Take 2 tablets by mouth daily.   12/02/2015 at Unknown time  . clopidogrel (PLAVIX) 75 MG tablet Take 1 tablet (75 mg total) by mouth daily. 30 tablet 12 12/03/2015 at Unknown time  . COMBIGAN 0.2-0.5 % ophthalmic solution Apply 1 drop to eye 2 (two) times daily.    12/02/2015 at Unknown time  . LUMIGAN 0.01 % SOLN Apply 1 drop to eye at bedtime.    12/02/2015 at Unknown time  . pantoprazole (PROTONIX) 40 MG tablet Take 40 mg by mouth 2 (two) times daily. Prescribed one tablet twice daily   12/02/2015 at  Unknown time  . Probiotic Product (TRUBIOTICS PO) Take 1 tablet by mouth daily.   12/03/2015 at Unknown time  . HYDROcodone-acetaminophen (NORCO) 5-325 MG tablet Take 1 tablet by mouth every 6 (six) hours as needed for severe pain. 5 tablet 0    Scheduled: . amLODipine  5 mg Oral Daily  . azithromycin  500 mg Intravenous Q24H  . brimonidine  1 drop Both Eyes BID WC   Or  . timolol  1 drop Both Eyes BID WC  . cefTRIAXone (ROCEPHIN)  IV  1 g Intravenous Q24H  . clopidogrel  75 mg Oral Daily  . heparin  5,000 Units Subcutaneous Q8H  . latanoprost  1 drop Both Eyes QHS  . pantoprazole  40 mg Oral BID  . potassium chloride  20 mEq Oral BID  . sodium chloride flush  3 mL Intravenous Q12H   Continuous:  ZOX:[id number] **OR** acetaminophen, albuterol, ALPRAZolam, ondansetron **OR** ondansetron (ZOFRAN) IV  Assesment: She was admitted with acute encephalopathy possibly related to her pneumonia. She may have had a TIA which she has had in the past. MRI did not show anything to suggest TIA so I'm  going on the assumption that this is related to her pneumonia she is very weak. Principal Problem:   Encephalopathy Active Problems:   TIA (transient ischemic attack)   HTN (hypertension)   PNA (pneumonia)   Pneumonia    Plan: PT consultation. She may be able to go home tomorrow with home health services.    LOS: 1 day   Judd Mccubbin L 12/05/2015, 10:39 AM

## 2015-12-05 NOTE — Progress Notes (Addendum)
PHARMACIST - PHYSICIAN COMMUNICATION DR:   Juanetta GoslingHawkins CONCERNING: Antibiotic IV to Oral Route Change Policy  RECOMMENDATION: This patient is receiving Azithromycin by the intravenous route.  Based on criteria approved by the Pharmacy and Therapeutics Committee, the antibiotic(s) is/are being converted to the equivalent oral dose form(s).   DESCRIPTION: These criteria include:  Patient being treated for a respiratory tract infection, urinary tract infection, cellulitis or clostridium difficile associated diarrhea if on metronidazole  The patient is not neutropenic and does not exhibit a GI malabsorption state  The patient is eating (either orally or via tube) and/or has been taking other orally administered medications for a least 24 hours  The patient is improving clinically and has a Tmax < 100.5  If you have questions about this conversion, please contact the Pharmacy Department . Will sign off, please reconsult if additional questions. Thanks [x]   [phone]( [phone] )  [facility] []   [phone]( [phone] )  [facility] []   [phone]( [phone] )  [facility] []   [phone]( [phone] )  [facility] []   [phone]( [phone] )  [facility]   Elder CyphersLorie Girolamo [place], [place] Loura BackPharm D, [place] Clinical Pharmacist Pager [phone]

## 2015-12-06 MED ORDER — TIMOLOL MALEATE 0.5 % OP SOLN
1.0000 [drp] | Freq: Two times a day (BID) | OPHTHALMIC | Status: DC
  Administered 2015-12-06: 1 [drp] via OPHTHALMIC

## 2015-12-06 MED ORDER — TIMOLOL MALEATE 0.5 % OP SOLN
1.0000 [drp] | Freq: Two times a day (BID) | OPHTHALMIC | Status: DC
  Administered 2015-12-06 (×2): 1 [drp] via OPHTHALMIC

## 2015-12-06 NOTE — Evaluation (Signed)
Physical Therapy Evaluation Patient Details Name: Tracey Walsh MRN: [id number] DOB: 11-22-1924 Today's Date: 12/06/2015   History of Present Illness  82 y.o. female, with a past medical history for hypertension and transient ischemic attack who ambulates by walker and lives alone, presents with complaints of difficulty in finding words that began yesterday. Patient was discharged few days ago for a fall resulting in hitting the left side of her chest wall. She has an  associated cough since the last 2 days. She Does have mild left-sided chest wall pain, no shortness of breath, no fever, or chills. Chest x-ray was significant for ? pneumonia. She was started on empirical antibiotics and admitted for further evaluation of community acquired pneumonia & rule out TIA/CVA - MRI is negative.   Clinical Impression  Pt received in bed, and was agreeable to PT evaluation.  Pt states she lives alone, and she uses her RW for household ambulation.  She is independent with ADL's, but requires assistance for running errands, and getting groceries from her dtr and friends.  During today's evaluation, she requires min guard for bed mobility, and supervision for gait with RW x 14300ft.  Pt would benefit from HHPT upon d/c due to diminished endurance, living alone, and she has a history of falling.      Follow Up Recommendations Home health PT    Equipment Recommendations  None recommended by PT    Recommendations for Other Services       Precautions / Restrictions Precautions Precautions: Fall Precaution Comments: bad bruising on her ribs - Oct 3. Restrictions Weight Bearing Restrictions: No      Mobility  Bed Mobility Overal bed mobility: Needs Assistance Bed Mobility: Supine to Sit     Supine to sit: Min guard;HOB elevated        Transfers Overall transfer level: Needs assistance Equipment used: Rolling walker (2 wheeled) Transfers: Sit to/from Stand Sit to Stand: Supervision             Ambulation/Gait Ambulation/Gait assistance: Supervision Ambulation Distance (Feet): 100 Feet Assistive device: Rolling walker (2 wheeled) Gait Pattern/deviations: Trunk flexed   Gait velocity interpretation: <1.8 ft/sec, indicative of risk for recurrent falls General Gait Details: Over pronationof the R foot - ambulated with shoes, as well as what looked to be store bought inserts.    Stairs            Wheelchair Mobility    Modified Rankin (Stroke Patients Only)       Balance Overall balance assessment: Needs assistance;History of Falls Sitting-balance support: Bilateral upper extremity supported;Feet supported Sitting balance-Leahy Scale: Good     Standing balance support: Bilateral upper extremity supported Standing balance-Leahy Scale: Poor                               Pertinent Vitals/Pain Pain Assessment: No/denies pain    Home Living   Living Arrangements: Alone Available Help at Discharge: Friend(s) (come every few days) Type of Home: House Home Access: Ramped entrance     Home Layout: One level (pt states she doesn't go upstairs) Home Equipment: Walker - 4 wheels;Shower seat;Bedside commode;Cane - single point;Wheelchair - manual Additional Comments: Pt states she has a w/c, but it is big - can't get it folded up to fit in the car.     Prior Function Level of Independence: Independent with assistive device(s)   Gait / Transfers Assistance Needed: Pt uses a RW for  ambulation  ADL's / Homemaking Assistance Needed: Dtr comes from [place] to help with running errands.  Pt states that her friends from church help with running errands. Pt is independent with dressing and bathing.         Hand Dominance   Dominant Hand: Right    Extremity/Trunk Assessment   Upper Extremity Assessment: Overall WFL for tasks assessed           Lower Extremity Assessment: Generalized weakness         Communication   Communication: No  difficulties  Cognition Arousal/Alertness: Awake/alert Behavior During Therapy: WFL for tasks assessed/performed Overall Cognitive Status: Within Functional Limits for tasks assessed                      General Comments      Exercises     Assessment/Plan    PT Assessment Patient needs continued PT services  PT Problem List Decreased strength;Decreased activity tolerance;Decreased balance;Decreased mobility;Decreased knowledge of use of DME;Decreased safety awareness;Decreased knowledge of precautions          PT Treatment Interventions DME instruction;Gait training;Functional mobility training;Therapeutic activities;Therapeutic exercise;Balance training;Patient/family education    PT Goals (Current goals can be found in the Care Plan section)  Acute Rehab PT Goals Patient Stated Goal: Pt states she will be going home PT Goal Formulation: With patient Time For Goal Achievement: 12/13/15 Potential to Achieve Goals: Good    Frequency Min 1X/week   Barriers to discharge Decreased caregiver support Pt lives alone    Co-evaluation               End of Session Equipment Utilized During Treatment: Gait belt Activity Tolerance: Patient tolerated treatment well Patient left: in chair;with call bell/phone within reach      Functional Assessment Tool Used: [employer] AM-PAC "6-clicks"  Functional Limitation: Mobility: Walking and moving around Mobility: Walking and Moving Around Current Status [phone](G8978): At least 1 percent but less than 20 percent impaired, limited or restricted Mobility: Walking and Moving Around Goal Status [phone](G8979): At least 1 percent but less than 20 percent impaired, limited or restricted    Time: 0922-0945 PT Time Calculation (min) (ACUTE ONLY): 23 min   Charges:   PT Evaluation $PT Eval Low Complexity: 1 Procedure PT Treatments $Gait Training: 8-22 mins   PT G Codes:   PT G-Codes **NOT FOR INPATIENT CLASS** Functional  Assessment Tool Used: [employer] AM-PAC "6-clicks"  Functional Limitation: Mobility: Walking and moving around Mobility: Walking and Moving Around Current Status [phone](G8978): At least 1 percent but less than 20 percent impaired, limited or restricted Mobility: Walking and Moving Around Goal Status [phone]([id number]): At least 1 percent but less than 20 percent impaired, limited or restricted   Beth Lexii Walsh, PT, DPT X: [phone]

## 2015-12-06 NOTE — Progress Notes (Signed)
Subjective: She remains very weak. She's concerned because her blood pressure has been up some in the hospital. She has also had some relatively low blood pressures. No complaints of chest pain. She is coughing a little bit. Her breathing is doing better. She walked a little bit yesterday with a walker.  Objective: Vital signs in last 24 hours: Temp:  [97.5 F (36.4 C)-98.6 F (37 C)] 98.3 F (36.8 C) (11/20 0605) Pulse Rate:  [70-79] 70 (11/20 0605) Resp:  [18-20] 20 (11/20 0605) BP: (134-177)/(69-81) 169/72 (11/20 0605) SpO2:  [97 %-99 %] 97 % (11/20 0605) Weight:  [63.5 kg (139 lb 15.9 oz)] 63.5 kg (139 lb 15.9 oz) (11/20 0605) Weight change: -2.1 kg (-4 lb 10.1 oz) Last BM Date: 12/05/15  Intake/Output from previous day: 11/19 0701 - 11/20 0700 In: 840 [P.O.:840] Out: 600 [Urine:600]  PHYSICAL EXAM General appearance: alert, cooperative and mild distress Resp: She still has some squeaking respirations on the left side Cardio: regular rate and rhythm, S1, S2 normal, no murmur, click, rub or gallop GI: soft, non-tender; bowel sounds normal; no masses,  no organomegaly Extremities: extremities normal, atraumatic, no cyanosis or edema Mucous membranes are moist. Pupils react. Skin warm and dry  Lab Results:  No results found for this or any previous visit (from the past 48 hour(s)).  ABGS No results for input(s): PHART, PO2ART, TCO2, HCO3 in the last 72 hours.  Invalid input(s): PCO2 CULTURES No results found for this or any previous visit (from the past 240 hour(s)). Studies/Results: No results found.  Medications:  Prior to Admission:  Prescriptions Prior to Admission  Medication Sig Dispense Refill Last Dose  . ALPRAZolam (XANAX) 0.5 MG tablet 1 tablet 3 (three) times daily as needed.   12/02/2015 at Unknown time  . amLODipine (NORVASC) 5 MG tablet 1 tablet daily.   12/03/2015 at Unknown time  . CALCIUM PO Take 2 tablets by mouth daily.   12/02/2015 at Unknown time   . clopidogrel (PLAVIX) 75 MG tablet Take 1 tablet (75 mg total) by mouth daily. 30 tablet 12 12/03/2015 at Unknown time  . COMBIGAN 0.2-0.5 % ophthalmic solution Apply 1 drop to eye 2 (two) times daily.    12/02/2015 at Unknown time  . LUMIGAN 0.01 % SOLN Apply 1 drop to eye at bedtime.    12/02/2015 at Unknown time  . pantoprazole (PROTONIX) 40 MG tablet Take 40 mg by mouth 2 (two) times daily. Prescribed one tablet twice daily   12/02/2015 at Unknown time  . Probiotic Product (TRUBIOTICS PO) Take 1 tablet by mouth daily.   12/03/2015 at Unknown time  . HYDROcodone-acetaminophen (NORCO) 5-325 MG tablet Take 1 tablet by mouth every 6 (six) hours as needed for severe pain. 5 tablet 0    Scheduled: . amLODipine  5 mg Oral Daily  . azithromycin  500 mg Oral Daily  . brimonidine  1 drop Both Eyes BID WC   Or  . timolol  1 drop Both Eyes BID WC  . cefTRIAXone (ROCEPHIN)  IV  1 g Intravenous Q24H  . clopidogrel  75 mg Oral Daily  . heparin  5,000 Units Subcutaneous Q8H  . latanoprost  1 drop Both Eyes QHS  . pantoprazole  40 mg Oral BID  . potassium chloride  20 mEq Oral BID  . sodium chloride flush  3 mL Intravenous Q12H   Continuous:  ZOX:[id number]:acetaminophen **OR** acetaminophen, albuterol, ALPRAZolam, ondansetron **OR** ondansetron (ZOFRAN) IV  Assesment: She was admitted with encephalopathy that  I think is related to community-acquired pneumonia. She is improving with her community-acquired pneumonia but is very weak. Her ability to manage at home was borderline even prior to this illness. PT consultation has been requested. Principal Problem:   Encephalopathy Active Problems:   TIA (transient ischemic attack)   HTN (hypertension)   PNA (pneumonia)   Pneumonia    Plan: PT consultation today. Possible discharge tomorrow.    LOS: 2 days   Nysa Sarin L 12/06/2015, 8:09 AM

## 2015-12-06 NOTE — Care Management Important Message (Signed)
Important Message  Patient Details  Name: Tracey Walsh MRN: [id number] Date of Birth: [birth year]   Medicare Important Message Given:  Yes    Mykenzi Vanzile, Chrystine OilerSharley Diane, RN 12/06/2015, 10:51 AM

## 2015-12-06 NOTE — Care Management Note (Signed)
Case Management Note  Patient Details  Name: Tracey Walsh MRN: [id number] Date of Birth: August 10, 1924  Subjective/Objective:    Patient adm from home with PNA. She lives alone, has a walker. She has a PCP, transportation, and insurance with drug coverage. PT recommends HH PT. Patient offered choice.           Action/Plan: Patient plans to [facility]. Alroy BailiffLinda Lothian of [facility] notified and will obtain orders from chart. [facility] aware that [facility].    Expected Discharge Date:     12/06/2015             Expected Discharge Plan:  Home w Home Health Services  In-House Referral:  NA  Discharge planning Services  CM Consult  Post Acute Care Choice:  Home Health Choice offered to:  Patient  DME Arranged:    DME Agency:     HH Arranged:  PT HH Agency:  [facility]  Status of Service:  In process, will continue to follow  If discussed at Long Length of Stay Meetings, dates discussed:    Additional Comments:  Shruthi Northrup, Chrystine OilerSharley Diane, RN 12/06/2015, 10:47 AM

## 2015-12-07 DIAGNOSIS — G9341 Metabolic encephalopathy: Secondary | ICD-10-CM | POA: Diagnosis present

## 2015-12-07 DIAGNOSIS — R531 Weakness: Secondary | ICD-10-CM

## 2015-12-07 MED ORDER — CEPHALEXIN 500 MG PO CAPS: 500.0000 mg | ORAL_CAPSULE | Freq: Three times a day (TID) | ORAL | 0 refills | Status: DC

## 2015-12-07 NOTE — Progress Notes (Signed)
Subjective: She feels better. It was recommended that she have home health PT. Her breathing is better. No other new complaints. She is very anxious about her blood pressure.  Objective: Vital signs in last 24 hours: Temp:  [97.5 F (36.4 C)-97.9 F (36.6 C)] 97.9 F (36.6 C) (11/21 0457) Pulse Rate:  [67-81] 72 (11/21 0457) Resp:  [20] 20 (11/21 0457) BP: (131-160)/(61-68) 160/68 (11/21 0457) SpO2:  [98 %] 98 % (11/21 0457) Weight:  [65.4 kg (144 lb 3.2 oz)] 65.4 kg (144 lb 3.2 oz) (11/21 0457) Weight change: 1.909 kg (4 lb 3.3 oz) Last BM Date: 12/06/15  Intake/Output from previous day: 11/20 0701 - 11/21 0700 In: 1013 [P.O.:960; I.V.:3; IV Piggyback:50] Out: 1000 [Urine:1000]  PHYSICAL EXAM General appearance: alert, cooperative and Anxious in no acute distress Resp: clear to auscultation bilaterally Cardio: regular rate and rhythm, S1, S2 normal, no murmur, click, rub or gallop GI: soft, non-tender; bowel sounds normal; no masses,  no organomegaly Extremities: extremities normal, atraumatic, no cyanosis or edema Skin warm and dry. Pupils reactive. Mucous membranes are moist  Lab Results:  No results found for this or any previous visit (from the past 48 hour(s)).  ABGS No results for input(s): PHART, PO2ART, TCO2, HCO3 in the last 72 hours.  Invalid input(s): PCO2 CULTURES No results found for this or any previous visit (from the past 240 hour(s)). Studies/Results: No results found.  Medications:  Prior to Admission:  Prescriptions Prior to Admission  Medication Sig Dispense Refill Last Dose  . ALPRAZolam (XANAX) 0.5 MG tablet 1 tablet 3 (three) times daily as needed.   12/02/2015 at Unknown time  . amLODipine (NORVASC) 5 MG tablet 1 tablet daily.   12/03/2015 at Unknown time  . CALCIUM PO Take 2 tablets by mouth daily.   12/02/2015 at Unknown time  . clopidogrel (PLAVIX) 75 MG tablet Take 1 tablet (75 mg total) by mouth daily. 30 tablet 12 12/03/2015 at Unknown  time  . COMBIGAN 0.2-0.5 % ophthalmic solution Apply 1 drop to eye 2 (two) times daily.    12/02/2015 at Unknown time  . LUMIGAN 0.01 % SOLN Apply 1 drop to eye at bedtime.    12/02/2015 at Unknown time  . pantoprazole (PROTONIX) 40 MG tablet Take 40 mg by mouth 2 (two) times daily. Prescribed one tablet twice daily   12/02/2015 at Unknown time  . Probiotic Product (TRUBIOTICS PO) Take 1 tablet by mouth daily.   12/03/2015 at Unknown time  . HYDROcodone-acetaminophen (NORCO) 5-325 MG tablet Take 1 tablet by mouth every 6 (six) hours as needed for severe pain. 5 tablet 0    Scheduled: . amLODipine  5 mg Oral Daily  . azithromycin  500 mg Oral Daily  . brimonidine  1 drop Both Eyes BID WC  . cefTRIAXone (ROCEPHIN)  IV  1 g Intravenous Q24H  . clopidogrel  75 mg Oral Daily  . heparin  5,000 Units Subcutaneous Q8H  . latanoprost  1 drop Both Eyes QHS  . pantoprazole  40 mg Oral BID  . potassium chloride  20 mEq Oral BID  . sodium chloride flush  3 mL Intravenous Q12H  . timolol  1 drop Both Eyes BID AC   Continuous:  UJW:[id number]:acetaminophen **OR** acetaminophen, albuterol, ALPRAZolam, ondansetron **OR** ondansetron (ZOFRAN) IV  Assesment: She was admitted with pneumonia. She had what appears to be a metabolic encephalopathy from that. She is doing better now. She is ready for discharge. She is anxious. She has hypertension and  her blood pressure has been up and down to some extent but I want to see what her blood pressure does at home in her normal environment before I modify her medications Principal Problem:   Encephalopathy Active Problems:   TIA (transient ischemic attack)   HTN (hypertension)   PNA (pneumonia)   Pneumonia    Plan: Discharge home today    LOS: 3 days   Leory Allinson L 12/07/2015, 7:40 AM

## 2015-12-07 NOTE — Discharge Summary (Signed)
Physician Discharge Summary  Patient ID: Tracey Walsh MRN: [id number] DOB/AGE: [birth year] 80 y.o. Primary Care Physician:Vanita Cannell L, MD Admit date: 12/03/2015 Discharge date: 12/07/2015    Discharge Diagnoses:   Principal Problem:   Encephalopathy Active Problems:   TIA (transient ischemic attack)   HTN (hypertension)   PNA (pneumonia)   Pneumonia   Encephalopathy, metabolic   Weakness     Medication List    STOP taking these medications   HYDROcodone-acetaminophen 5-325 MG tablet Commonly known as:  NORCO     TAKE these medications   ALPRAZolam 0.5 MG tablet Commonly known as:  XANAX 1 tablet 3 (three) times daily as needed.   amLODipine 5 MG tablet Commonly known as:  NORVASC 1 tablet daily.   CALCIUM PO Take 2 tablets by mouth daily.   cephALEXin 500 MG capsule Commonly known as:  KEFLEX Take 1 capsule (500 mg total) by mouth 3 (three) times daily.   clopidogrel 75 MG tablet Commonly known as:  PLAVIX Take 1 tablet (75 mg total) by mouth daily.   COMBIGAN 0.2-0.5 % ophthalmic solution Generic drug:  brimonidine-timolol Apply 1 drop to eye 2 (two) times daily.   LUMIGAN 0.01 % Soln Generic drug:  bimatoprost Apply 1 drop to eye at bedtime.   pantoprazole 40 MG tablet Commonly known as:  PROTONIX Take 40 mg by mouth 2 (two) times daily. Prescribed one tablet twice daily   TRUBIOTICS PO Take 1 tablet by mouth daily.            Durable Medical Equipment        Start     Ordered   12/03/15 1423  For home use only DME oxygen  Once    Question:  Oxygen delivery system  Answer:  Gas   12/03/15 1423      Discharged Condition:Improved    Consults: None  Significant Diagnostic Studies: Dg Chest 2 View  Result Date: 12/03/2015 CLINICAL DATA:  Intermittent cough with nausea and vomiting today. EXAM: CHEST  2 VIEW COMPARISON:  Chest x-ray of October 19, 2015 and November 28, 2012. FINDINGS: The lungs are well-expanded. There is  increased density overlying the cardiac silhouette which is likely in the lingula. The heart and pulmonary vascularity are normal. There is calcification in the wall of the aortic arch. There is no pleural effusion or pneumothorax. There is mild dextrocurvature centered at the thoracolumbar junction. IMPRESSION: COPD. Lingular atelectasis or pneumonia. Followup PA and lateral chest X-ray is recommended in 3-4 weeks following trial of antibiotic therapy to ensure resolution and exclude underlying malignancy. Thoracic aortic atherosclerosis. Electronically Signed   By: David  SwazilandJordan M.D.   On: 12/03/2015 10:53   Dg Abd 1 View  Result Date: 12/03/2015 CLINICAL DATA:  Nausea and vomiting x3 today. EXAM: ABDOMEN - 1 VIEW COMPARISON:  None. FINDINGS: Single supine view the abdomen and pelvis. Non-obstructive bowel gas pattern. No abnormal abdominal calcifications. No appendicolith. Moderate amount of stool in the rectum. Mild osteopenia. IMPRESSION: No acute findings. Electronically Signed   By: Jeronimo GreavesKyle  Talbot M.D.   On: 12/03/2015 13:43   Mr Brain Wo Contrast  Result Date: 12/03/2015 CLINICAL DATA:  82 y/o F; increasing confusion, weakness, speech disturbance. EXAM: MRI HEAD WITHOUT CONTRAST TECHNIQUE: Multiplanar, multiecho pulse sequences of the brain and surrounding structures were obtained without intravenous contrast. COMPARISON:  10/08/2012 MRI head. FINDINGS: Brain: No acute infarction, hemorrhage, hydrocephalus, extra-axial collection or mass lesion. Nonspecific foci of T2 FLAIR hyperintense signal abnormality in periventricular and  subcortical white matter are consistent with mild chronic microvascular ischemic changes. Mild parenchymal volume loss. Small left cerebellar hemisphere chronic lacunar infarct. Right pontomesencephalic stable focus of susceptibility hypointensity compatible with hemosiderin deposition from old microhemorrhage. Vascular: Normal flow voids. Skull and upper cervical spine:  Normal marrow signal. Sinuses/Orbits: Partial left mastoid opacification. Mild paranasal sinus mucosal thickening. Orbits are unremarkable. Other: None. IMPRESSION: 1. No acute intracranial abnormality. 2. Mild stable chronic microvascular ischemic changes and parenchymal volume loss for age. 3. Mild paranasal sinus disease. Electronically Signed   By: Mitzi HansenLance  Furusawa-Stratton M.D.   On: 12/03/2015 13:39    Lab Results: Basic Metabolic Panel: No results for input(s): NA, K, CL, CO2, GLUCOSE, BUN, CREATININE, CALCIUM, MG, PHOS in the last 72 hours. Liver Function Tests: No results for input(s): AST, ALT, ALKPHOS, BILITOT, PROT, ALBUMIN in the last 72 hours.   CBC: No results for input(s): WBC, NEUTROABS, HGB, HCT, MCV, PLT in the last 72 hours.  No results found for this or any previous visit (from the past 240 hour(s)).   Hospital Course: She came to the hospital after having altered speech pattern. This is been going on for about 24 hours. She has previous history of TIA. After she got to the hospital her speech was back to normal. However she was found to have pneumonia. There was concern that she might have had a TIA and she had MRI but MRI did not show any evidence of any sort of vascular incident in her brain. Her speech abnormality is felt to be metabolic encephalopathy from the pneumonia. She was treated with intravenous antibiotics and improved. She was very weak and PT consultation was obtained to see if she was a candidate for rehabilitation and she was not. She is going to go home with home health services. She is much improved  Discharge Exam: Blood pressure (!) 160/68, pulse 72, temperature 97.9 F (36.6 C), temperature source Oral, resp. rate 20, height 5' (1.524 m), weight 65.4 kg (144 lb 3.2 oz), SpO2 98 %. Her chest is clear. She is anxious. Her heart is regular.  Disposition: Home with home health services. Her blood pressure has been variable in the hospital so I want to see  how she does at home before I alter her medications.  Discharge Instructions    Face-to-face encounter (required for Medicare/Medicaid patients)    Complete by:  As directed    I Orlen Leedy L certify that this patient is under my care and that I, or a nurse practitioner or physician's assistant working with me, had a face-to-face encounter that meets the physician face-to-face encounter requirements with this patient on 12/07/2015. The encounter with the patient was in whole, or in part for the following medical condition(s) which is the primary reason for home health care (List medical condition): pneumonia   The encounter with the patient was in whole, or in part, for the following medical condition, which is the primary reason for home health care:  pneumonia   I certify that, based on my findings, the following services are medically necessary home health services:   Nursing Physical therapy     Reason for Medically Necessary Home Health Services:  Skilled Nursing- Change/Decline in Patient Status   My clinical findings support the need for the above services:  Unable to leave home safely without assistance and/or assistive device   Further, I certify that my clinical findings support that this patient is homebound due to:  Unable to leave home safely without  assistance   Home Health    Complete by:  As directed    To provide the following care/treatments:   PT RN Home Health Aide          Signed: Pragya Lofaso L   12/07/2015, 7:43 AM

## 2015-12-07 NOTE — Progress Notes (Signed)
Patient with orders to be discharge home. Discharge instructions given, patient and caregiver verbalized understanding. Patient stable. Patient left in private vehicle with caregiver. 

## 2015-12-08 DIAGNOSIS — G9341 Metabolic encephalopathy: Secondary | ICD-10-CM | POA: Diagnosis not present

## 2015-12-08 DIAGNOSIS — Z8673 Personal history of transient ischemic attack (TIA), and cerebral infarction without residual deficits: Secondary | ICD-10-CM | POA: Diagnosis not present

## 2015-12-08 DIAGNOSIS — Z1211 Encounter for screening for malignant neoplasm of colon: Secondary | ICD-10-CM | POA: Diagnosis not present

## 2015-12-08 DIAGNOSIS — R195 Other fecal abnormalities: Secondary | ICD-10-CM | POA: Diagnosis not present

## 2015-12-08 DIAGNOSIS — J189 Pneumonia, unspecified organism: Secondary | ICD-10-CM | POA: Diagnosis not present

## 2015-12-08 DIAGNOSIS — Z7902 Long term (current) use of antithrombotics/antiplatelets: Secondary | ICD-10-CM | POA: Diagnosis not present

## 2015-12-08 DIAGNOSIS — I10 Essential (primary) hypertension: Secondary | ICD-10-CM | POA: Diagnosis not present

## 2015-12-15 DIAGNOSIS — G9341 Metabolic encephalopathy: Secondary | ICD-10-CM | POA: Diagnosis not present

## 2015-12-15 DIAGNOSIS — Z8673 Personal history of transient ischemic attack (TIA), and cerebral infarction without residual deficits: Secondary | ICD-10-CM | POA: Diagnosis not present

## 2015-12-15 DIAGNOSIS — J189 Pneumonia, unspecified organism: Secondary | ICD-10-CM | POA: Diagnosis not present

## 2015-12-15 DIAGNOSIS — I10 Essential (primary) hypertension: Secondary | ICD-10-CM | POA: Diagnosis not present

## 2015-12-15 DIAGNOSIS — Z7902 Long term (current) use of antithrombotics/antiplatelets: Secondary | ICD-10-CM | POA: Diagnosis not present

## 2015-12-17 DIAGNOSIS — Z7902 Long term (current) use of antithrombotics/antiplatelets: Secondary | ICD-10-CM | POA: Diagnosis not present

## 2015-12-17 DIAGNOSIS — I10 Essential (primary) hypertension: Secondary | ICD-10-CM | POA: Diagnosis not present

## 2015-12-17 DIAGNOSIS — J189 Pneumonia, unspecified organism: Secondary | ICD-10-CM | POA: Diagnosis not present

## 2015-12-17 DIAGNOSIS — G9341 Metabolic encephalopathy: Secondary | ICD-10-CM | POA: Diagnosis not present

## 2015-12-17 DIAGNOSIS — Z8673 Personal history of transient ischemic attack (TIA), and cerebral infarction without residual deficits: Secondary | ICD-10-CM | POA: Diagnosis not present

## 2015-12-22 DIAGNOSIS — G9341 Metabolic encephalopathy: Secondary | ICD-10-CM | POA: Diagnosis not present

## 2015-12-22 DIAGNOSIS — J189 Pneumonia, unspecified organism: Secondary | ICD-10-CM | POA: Diagnosis not present

## 2015-12-22 DIAGNOSIS — I10 Essential (primary) hypertension: Secondary | ICD-10-CM | POA: Diagnosis not present

## 2015-12-22 DIAGNOSIS — Z8673 Personal history of transient ischemic attack (TIA), and cerebral infarction without residual deficits: Secondary | ICD-10-CM | POA: Diagnosis not present

## 2015-12-22 DIAGNOSIS — Z7902 Long term (current) use of antithrombotics/antiplatelets: Secondary | ICD-10-CM | POA: Diagnosis not present

## 2015-12-24 DIAGNOSIS — Z7902 Long term (current) use of antithrombotics/antiplatelets: Secondary | ICD-10-CM | POA: Diagnosis not present

## 2015-12-24 DIAGNOSIS — G9341 Metabolic encephalopathy: Secondary | ICD-10-CM | POA: Diagnosis not present

## 2015-12-24 DIAGNOSIS — I10 Essential (primary) hypertension: Secondary | ICD-10-CM | POA: Diagnosis not present

## 2015-12-24 DIAGNOSIS — Z8673 Personal history of transient ischemic attack (TIA), and cerebral infarction without residual deficits: Secondary | ICD-10-CM | POA: Diagnosis not present

## 2015-12-24 DIAGNOSIS — J189 Pneumonia, unspecified organism: Secondary | ICD-10-CM | POA: Diagnosis not present

## 2015-12-27 DIAGNOSIS — I10 Essential (primary) hypertension: Secondary | ICD-10-CM | POA: Diagnosis not present

## 2015-12-27 DIAGNOSIS — M159 Polyosteoarthritis, unspecified: Secondary | ICD-10-CM | POA: Diagnosis not present

## 2015-12-27 DIAGNOSIS — F419 Anxiety disorder, unspecified: Secondary | ICD-10-CM | POA: Diagnosis not present

## 2015-12-27 DIAGNOSIS — K219 Gastro-esophageal reflux disease without esophagitis: Secondary | ICD-10-CM | POA: Diagnosis not present

## 2015-12-28 DIAGNOSIS — I10 Essential (primary) hypertension: Secondary | ICD-10-CM | POA: Diagnosis not present

## 2015-12-28 DIAGNOSIS — G9341 Metabolic encephalopathy: Secondary | ICD-10-CM | POA: Diagnosis not present

## 2015-12-28 DIAGNOSIS — J189 Pneumonia, unspecified organism: Secondary | ICD-10-CM | POA: Diagnosis not present

## 2015-12-28 DIAGNOSIS — Z8673 Personal history of transient ischemic attack (TIA), and cerebral infarction without residual deficits: Secondary | ICD-10-CM | POA: Diagnosis not present

## 2015-12-28 DIAGNOSIS — Z7902 Long term (current) use of antithrombotics/antiplatelets: Secondary | ICD-10-CM | POA: Diagnosis not present

## 2015-12-31 DIAGNOSIS — Z7902 Long term (current) use of antithrombotics/antiplatelets: Secondary | ICD-10-CM | POA: Diagnosis not present

## 2015-12-31 DIAGNOSIS — I10 Essential (primary) hypertension: Secondary | ICD-10-CM | POA: Diagnosis not present

## 2015-12-31 DIAGNOSIS — G9341 Metabolic encephalopathy: Secondary | ICD-10-CM | POA: Diagnosis not present

## 2015-12-31 DIAGNOSIS — J189 Pneumonia, unspecified organism: Secondary | ICD-10-CM | POA: Diagnosis not present

## 2015-12-31 DIAGNOSIS — Z8673 Personal history of transient ischemic attack (TIA), and cerebral infarction without residual deficits: Secondary | ICD-10-CM | POA: Diagnosis not present

## 2016-01-19 ENCOUNTER — Other Ambulatory Visit: Payer: Self-pay | Admitting: *Deleted

## 2016-01-19 NOTE — Patient Outreach (Signed)
[facility] ([facility]) Care Management  01/19/2016  Tracey Walsh 09/02/1924 [id number]  Referral from [facility] for ongoing education & diseases management:  Has [employer] insurance Recent Hospital stay at [facility] 11/17-11/21/2017-DX metabolic encephalopathy, pneumonia.  Telephone call to patient who was advised of reason for call & of [facility] care management services.  HIPPA verification received from patient.   Patient voices that currently she has knee pain & requires use of seated 4 wheeled walker. States she has been advised that she has osteoarthritis. States she gets off balance sometimes due to knee problem. Has fallen 2 times in last 3 months.  Has seen bone doctor in the past and received injections.  States most recent primary care visit was Dec 11 th with Dr. [name]. Has next appointment Feb. 13th.  States friends  take her to MD appointments.  .  Patient states she  gets meals Monday thru Friday from [employer] and friends assist with getting meals to her also. States daughter who lives out of town sees her every several weeks & runs errands for her when she comes to visit.   Patient states she gets prescriptions filled at [facility] & they deliver. States she manages own medications & takes consistently as prescribed by her doctors.   States she has blood pressure monitor but has not taken lately because it was elevated when she was in hospital. States she had home health services about 2 weeks following hospital discharge but services have been completed now.   Recommendations for referral to [facility] Community Care Coordinator for patient that is high falls risk, recent hospital discharge with dx pneumonia, encephalopathy. Patient lives alone.  Has support from friends. Has problem with knee pain requiring use of walker. Patient has hx of mini-stroke, glaucoma, HTN, hard of hearing .  Patient consents to [facility]  services.   Referral to care management assistant to assign to Community Care  Coordinator for complex case management.  Naquita Nappier, RN BSN CCM Care Management Coordinator [facility] Care Management  [phone]  

## 2016-01-19 NOTE — Patient Outreach (Signed)
[facility]) Care Management  01/19/2016  Tracey Walsh Aug 08, 1924 [id number]  Referral from [facility] for ongoing education & diseases management:  Has [employer] insurance [facility] 11/17-11/21/2017-DX metabolic encephalopathy, pneumonia.  Telephone call to patient who was advised of reason for call & [facility].  HIPPA verification received from patient.   Patient voices that currently she has knee pain & requires use of seated 4 wheeled walker. States she has been advised that she has osteoarthritis. States she gets off balance sometimes due to knee problem. Has fallen 2 times in last 3 months.  Has seen bone doctor in the past and received injections.  States most recent primary care visit was Dec 11 th with Dr. Kari BaarsEdward Hawkins. Has next appointment Feb. 13th.  States friends  take her to MD appointments.  .  Patient states she  gets meals Monday thru Friday from [employer] and friends assist with getting meals to her also. States daughter who lives out of town sees her every several weeks & runs errands for her when she comes to visit.   Patient states she [facility] & they deliver. States she manages own medications & takes consistently as prescribed by her doctors.   States she has blood pressure monitor but has not taken lately because it was elevated when she was in hospital. States she had home health services about 2 weeks following hospital discharge but services have been completed now.   Recommendations for referral to [facility] Coordinator for patient that is high falls risk, recent hospital discharge with dx pneumonia, encephalopathy. Patient lives alone.  Has support from friends. Has problem with knee pain requiring use of walker. Patient has hx of mini-stroke, glaucoma, HTN, hard of hearing .  [facility].   Referral to care management assistant to [facility] for complex case management.  [name], RN BSN CCM Care Management Coordinator [facility]  [phone]

## 2016-01-24 ENCOUNTER — Other Ambulatory Visit: Payer: Self-pay | Admitting: *Deleted

## 2016-01-24 NOTE — Patient Outreach (Signed)
[facility] Care Management  01/24/2016  Tracey Walsh Dec 10, 1924 [id number]  [name] is a 83 year old female patient with medical history of osteoarthritis, glaucoma, hearing loss, transient ischemic attack, hypertension, and pneumonia. [name] was was hospitalized from 12/03/15-12/07/15 after she presented with altered speech pattern and was diagnosed with metabolic encephalopathy and pneumonia. She was discharged to home with home health services but they have completed all visits at this time.   Tracey Walsh was last seen by Dr. Kari BaarsEdward Hawkins, her primary care provider, on 12/27/15. She has a scheduled appointment with him on 02/29/16. Her friend provides transportation to and from appointments.  Home Safety/Fall Risk - Tracey Walsh was contacted by our telephonic case management team and at that time related that she has problems with balance and knee pain & requires use of seated, 4 wheeled walker.She reported having fallen 2 times in the last 3 months. She also has glaucoma and lives alone. She is at high risk for continued falls.  Social/Home Management - Tracey Walsh gets meals on wheels deliveries Monday thru Friday. She also has friends who assist with meals. She has a daughter who lives out of town but sees her every several weeks & runs errands for her when she comes to visit. She has prescriptions filled and delivered [facility]. She manages her own medications at home.    Self Health Management Deficits -  Tracey Walsh has a blood pressure monitor at home but has not been using it per her admission during her conversation with our telephonic case manager.   Plan: I will reach out to Tracey Walsh by phone tomorrow and discuss her health care needs. If she agrees, I will schedule an in person home visit to further assess needs and see if we can provide assistance.    Tracey Walsh MHA,BSN,RN,CCM [facility] Care [facility]  [phone]

## 2016-01-26 ENCOUNTER — Other Ambulatory Visit: Payer: Self-pay | Admitting: *Deleted

## 2016-01-26 NOTE — Patient Outreach (Signed)
[facility]) Care Management  01/26/2016  Tracey Walsh 07/29/1924 [id number]  Tracey Walsh is an 83 year old female living in [place] with history of metabolic encephalopathy and pneumonia for which she was hospitalized in November of 2017, transient ischemic attack, hypertension, chronic urinary tract infection, and generalized weakness. [facility] complex case management and education needs.   I spoke with Tracey Walsh and her primary caregiver Tracey Walsh. Today, TraceyWalsh is not feeling well and complains of urinary symptoms which have not resolved even though she has completed a course of oral antibiotics prescribed by Dr. Juanetta GoslingHawkins on 12/28/15. Tracey Walsh's husband went to Dr. Juanetta GoslingHawkins office this morning to pass along the message. I reached out to [facility] at Dr. Juanetta GoslingHawkins office and she confirmed that the information about Tracey Walsh symptoms were received and were being passed on to Dr. Juanetta GoslingHawkins. Someone from Dr. Juanetta GoslingHawkins' office is to contact Tracey Walsh and/or Tracey Walsh regarding instructions.   Tracey Walsh lives alone in [place] own [place]. She has 2 daughters who live out of town but visit on the weekend. Tracey Walsh is the primary, day to day, caregiver and provides transportation to appointments and assistance with home management.   Tracey Walsh consented to a home visit next week and asked for Tracey Walsh to be present.   Plan: I will see Tracey Walsh at home next week for a new patient visit in her home on Thursday @ 10:45am.    Tracey Walsh [facility] Center[facility]HA,BSN,RN,CCM [facility] Care Management  [phone]

## 2016-02-01 DIAGNOSIS — Z7902 Long term (current) use of antithrombotics/antiplatelets: Secondary | ICD-10-CM | POA: Diagnosis not present

## 2016-02-01 DIAGNOSIS — K589 Irritable bowel syndrome without diarrhea: Secondary | ICD-10-CM | POA: Diagnosis not present

## 2016-02-01 DIAGNOSIS — F419 Anxiety disorder, unspecified: Secondary | ICD-10-CM | POA: Diagnosis not present

## 2016-02-01 DIAGNOSIS — R627 Adult failure to thrive: Secondary | ICD-10-CM | POA: Diagnosis not present

## 2016-02-01 DIAGNOSIS — I10 Essential (primary) hypertension: Secondary | ICD-10-CM | POA: Diagnosis not present

## 2016-02-01 DIAGNOSIS — M1991 Primary osteoarthritis, unspecified site: Secondary | ICD-10-CM | POA: Diagnosis not present

## 2016-02-01 DIAGNOSIS — N3281 Overactive bladder: Secondary | ICD-10-CM | POA: Diagnosis not present

## 2016-02-03 ENCOUNTER — Ambulatory Visit: Payer: Self-pay | Admitting: *Deleted

## 2016-02-08 ENCOUNTER — Other Ambulatory Visit: Payer: Self-pay | Admitting: *Deleted

## 2016-02-08 NOTE — Patient Outreach (Signed)
[facility](THN) Care Management   02/08/2016  Tracey Walsh 09/05/1924 [id number]  Tracey Walsh is an 83 y.o. female with medical history of osteoarthritis, glaucoma, hearing loss, transient ischemic attack, hypertension, and pneumonia. [name] was was hospitalized from 12/03/15-12/07/15 after she presented with altered speech pattern and was diagnosed with metabolic encephalopathy and pneumonia. She was discharged to home with home health services but they have completed all visits at this time.   [name] was last seen by Dr. Kari BaarsEdward Walsh, her primary care provider, on 12/27/15. She has a scheduled appointment with him on 02/29/16. Her friend provides transportation to and from appointments.  Subjective: "It makes me nervous when I check my blood pressure and I'm not sure how to work my machine."  Objective:  BP 132/74   Pulse 68   Wt 143 lb (64.9 kg)   SpO2 98%   BMI 27.93 kg/m   Review of Systems  Constitutional: Negative.   HENT: Negative.   Eyes: Negative.        Has glaucoma; uses prescribed drops  Respiratory: Negative.   Cardiovascular: Negative.   Gastrointestinal: Negative.   Genitourinary: Negative.   Musculoskeletal: Negative.  Negative for falls.  Skin: Negative.   Neurological: Negative.   Psychiatric/Behavioral: The patient is nervous/anxious.        Mildly anxious    Physical Exam  Constitutional: She is oriented to person, place, and time. Vital signs are normal. She appears well-developed and well-nourished. She is active. She does not have a sickly appearance. She does not appear ill.  Cardiovascular: Normal rate, regular rhythm and normal heart sounds.   No murmur heard. Respiratory: Effort normal and breath sounds normal. She has no wheezes. She has no rhonchi. She has no rales.  GI: Soft. Bowel sounds are normal.  Neurological: She is alert and oriented to person, place, and time.  Skin: Skin is warm, dry and intact.   Psychiatric: She has a normal mood and affect. Her speech is normal and behavior is normal. Judgment and thought content normal. Cognition and memory are normal.    Encounter Medications:   Outpatient Encounter Prescriptions as of 02/08/2016  Medication Sig Note  . ALPRAZolam (XANAX) 0.5 MG tablet 1 tablet 3 (three) times daily as needed. 10/19/2015: Received from: External Pharmacy  . amLODipine (NORVASC) 5 MG tablet 1 tablet daily. 10/19/2015: Received from: External Pharmacy  . CALCIUM PO Take 2 tablets by mouth daily.   . cephALEXin (KEFLEX) 500 MG capsule Take 1 capsule (500 mg total) by mouth 3 (three) times daily. COMPLETED  . clopidogrel (PLAVIX) 75 MG tablet Take 1 tablet (75 mg total) by mouth daily.   . COMBIGAN 0.2-0.5 % ophthalmic solution Apply 1 drop to eye 2 (two) times daily.    Marland Kitchen. LUMIGAN 0.01 % SOLN Apply 1 drop to eye at bedtime.    . pantoprazole (PROTONIX) 40 MG tablet Take 40 mg by mouth 2 (two) times daily. Prescribed one tablet twice daily   . Probiotic Product (TRUBIOTICS PO) Take 1 tablet by mouth daily.    Assessment:  83 year old female living alone in [place] who had recent hospitalization for encephalopathy and pneumonia and has care coordination needs related to fall risk and self health management.   Home Safety/Fall Risk - [name] was contacted by our telephonic case management team and at that time related that she has problems with balance and has fallen 2 times in the last 3 months. She also  has glaucoma and lives alone. [name] has abnormal gait but moves about the house fairly easily with her walker. She cannot rise without use of her walker. She sometimes uses furniture for balance. She has carpet and area rugs throughout the house, contributing to increased fall risk. She is at high risk for continued falls.  Social/Home Management - [name] gets meals on wheels deliveries Monday thru Friday. She also has friends who assist with meals. She  has a daughter who lives out of town but sees her every several weeks &runs errands for her when she comes to visit. She has prescriptions filled and delivered from [place]. She manages her own medications at home. [name] appears to have a very strong social/family network and has a close friend Tracey Walsh who takes her to appointments, to the grocery store, and to her hair appointments.   Self Health Management Deficits -  Tracey Walsh has a blood pressure monitor at home but has not been using it because she says she is unsure how to use it and it makes her nervous when she thinks about checking it. We got her monitor out today and practiced with it. I advised [name] to practice on her own at her convenience, to increase her comfort level with self monitoring.   Acute Health condition (UTI)/resolved - [name] was taking Keflex for UTI; she has completed the course of antibiotics but says she has had urinary problems for years and keeps cranberry pills and AZO standard on hand. She currently is asymptomatic.   Plan:   [name] last saw Dr. Juanetta Walsh on 12/27/15 and her next visit is scheduled for 02/29/16.   I will discuss my visit with [name] with her friend and caregiver, [name]. [name] sees [name] most frequently so it will be important to get [name]'s input on needs TraceyTracey Walsh may have.   I anticipate that [name] will be transitioned to telephonic case management for ongoing follow up but I will see her at home once again in the next month.    Marja Kays MHA,BSN,RN,CCM [facility] Care Management  [phone]

## 2016-02-09 ENCOUNTER — Encounter: Payer: Self-pay | Admitting: *Deleted

## 2016-02-10 ENCOUNTER — Other Ambulatory Visit: Payer: Self-pay | Admitting: *Deleted

## 2016-02-10 NOTE — Patient Outreach (Signed)
[facility](THN) Care Management  02/10/2016  Tracey Walsh September 23, 1924 [id number]  Per Tracey Walsh's request, I followed up today by phone with her close friend and caregiver Tracey CirriJanet Walsh to update her on my visit with Tracey Walsh. As I discussed with Tracey Walsh, I told Tracey Walsh that Tracey Walsh is currently managing her chronic disease states well, taking her medications as prescribed, and is attending provider appointments as scheduled. We discussed my efforts with Tracey Walsh to teach her how to self monitor blood pressure.   Plan: I will reach out to Tracey Walsh by phone again over the next few weeks and will schedule another face to face visit with her. If she continues to do well, we will transition to telephonic case management.    Tracey Walsh MHA,BSN,RN,CCM [facility] Care Management  [phone]

## 2016-02-29 ENCOUNTER — Other Ambulatory Visit: Payer: Self-pay | Admitting: *Deleted

## 2016-02-29 NOTE — Patient Outreach (Signed)
[facility](THN) Care Management  02/29/2016  Tracey Walsh 02-22-1924 [id number]  Call received this afternoon from Ms. Tracey Walsh, primary caregiver for Tracey Walsh. She told me that Tracey Walsh is experiencing urinary hesitancy and frequency in addition to burning. Tracey Walsh recently completed a course of oral antibiotics for UTI and says that she feels her UTI is not quite resolved.   I helped Tracey Walsh notify Tracey Walsh of her symptoms and left a message with [name] requesting a return call.   Plan: I will follow up with [name] and/or Tracey Walsh re: treatment of symptoms over the next 48 hours.    Marja Kayslisa Gilboy MHA,BSN,RN,CCM [facility] Care Management  [phone]

## 2016-03-01 ENCOUNTER — Other Ambulatory Visit: Payer: Self-pay | Admitting: *Deleted

## 2016-03-01 NOTE — Patient Outreach (Signed)
[facility](THN) Care Management  03/01/2016  Tracey Walsh 1924/11/30 [id number]  I reached out to the office of Dr. Kari BaarsEdward Hawkins to follow up on request for advice regarding Tracey Walsh's report of UTI symptoms. I left a voice message for the nurse and for [name], requesting a call to the patient or a return call to me.   Plan: I will follow up with Tracey Walsh and Dr.Hawkins' office tomorrow.    Marja Kayslisa Gilboy MHA,BSN,RN,CCM [facility] Care Management  [phone]

## 2016-03-02 ENCOUNTER — Other Ambulatory Visit: Payer: Self-pay | Admitting: *Deleted

## 2016-03-02 NOTE — Patient Outreach (Signed)
[facility](THN) Care Management  03/02/2016  Thomasena Edisileen O Hallas January 01, 1925 [id number]   I stopped by the office of Dr. Kari BaarsEdward Hawkins to follow up on request for advice regarding Mrs. Puerto's report of UTI symptoms. Dr. Juanetta GoslingHawkins prescribed and faxed prescription for Cipro (longer course) and recommends that she see a urologist.   I confirmed with caregiver [name] that Mrs. Holzschuh received prescribed antibiotics and also helped her schedule a follow up appointment with Dr. Juanetta GoslingHawkins on May 23, 2016 @ 2pm.   Per Ms. [name] and Mrs. Banks she was referred to a urologist in [place] many years ago when she was under the care of Dr. May. She does not have a local urologist and would like to defer to Dr. Juanetta GoslingHawkins' recommendation. I notified [name] at Dr. Juanetta GoslingHawkins office of Mrs. Dancel's provided information and also gave Becky contact information for Ms. Adriana Simasook, as per patient request.   Plan: I will follow up with Mrs. Nesbitt/Ms. Cook no later than next week to see how she is doing with urinary symptoms, new course of antibiotics, and urology referral status.    Marja Kayslisa Dailin Sosnowski MHA,BSN,RN,CCM [facility] Care Management  [phone]

## 2016-03-07 ENCOUNTER — Other Ambulatory Visit: Payer: Self-pay | Admitting: *Deleted

## 2016-03-07 NOTE — Patient Outreach (Signed)
[facility]N) Care Management  03/07/2016  Thomasena Edisileen O Burkel October 15, 1924 [id number]  Thomasena Edisileen O Scherr is an 83 y.o. female with medical history of osteoarthritis, glaucoma, hearing loss, transient ischemic attack, hypertension, and pneumonia. [name] was was hospitalized from 12/03/15-12/07/15 after she presented with altered speech pattern and was diagnosed with metabolic encephalopathy and pneumonia.   I visited with Mrs. Harrison at home a few weeks ago and have spoken with her by phone since to address questions and care coordination needs related to urinary symptoms. Dr. Juanetta GoslingHawkins started her on oral antibiotics and recommended referral to a local urologist. His office was to make a urology referral for her.   I called Mrs. Heino today and wasn't able to reach her but was able to speak with her primary caregiver, [name], by phone when she returned a call to me on behalf of Mrs. Mroczka who told her about my message.   [name] states that Mrs. Peatross's urinary symptoms are improved. She is taking antibiotics as prescribed. She has not heard from a urologist but knows she is being referred to one by Dr. Juanetta GoslingHawkins' office.   Plan: I will follow up with Mrs. Reif in one week to see if she has heard from urology and to schedule our next face to face visit.    Marja Kayslisa Gilboy MHA,BSN,RN,CCM [facility] Care Management  [phone]

## 2016-03-13 ENCOUNTER — Other Ambulatory Visit: Payer: Self-pay | Admitting: *Deleted

## 2016-03-13 NOTE — Patient Outreach (Signed)
[facility](THN) Care Management  03/13/2016  Tracey Walsh 1924/01/28 [id number]   Call received from [name] who reports that she has had chest "soreness" off and on in the mornings when she awakens. She reports that the discomfort usually subsides as she gets up and moves around. She also reports improvement with use of incentive spirometer and breathing treatments. She asked if I would "check her breathing" sometime this week.   Plan: I have scheduled a home visit tomorrow at [name]10am.    Kahron Kauth [facility] Institute[facility]HA,BSN,RN,CCM [facility] Care Management  [phone]) [phone] '

## 2016-03-14 ENCOUNTER — Other Ambulatory Visit: Payer: Self-pay | Admitting: *Deleted

## 2016-03-14 NOTE — Patient Outreach (Signed)
[facility](THN) Care Management   03/14/2016  Tracey Walsh [birth year] [id number]  Tracey Walsh is an [age] y.o. female with medical history of osteoarthritis, glaucoma, hearing loss, transient ischemic attack, hypertension, and pneumonia. Tracey Walsh was was hospitalized from 12/03/15-12/07/15 after she presented with altered speech pattern and was diagnosed with metabolic encephalopathy and pneumonia.   Tracey Walsh was last seen by Tracey Walsh, her primary care provider, on 02/29/16. Her friend Tracey CirriJanet Walsh [phone]) provides transportation to and from appointments. Tracey Walsh has asked me to communicate with Tracey Walsh about all our visits and especially about appointment scheduling.   I am seeing Tracey Walsh today at her request because of recent intermittent morning chest "soreness" and about her concerns around blood pressure management.   Subjective: "I think I'm doing pretty good. My back gets kind of sore when I sit wrong."   Objective:  BP 120/68   Pulse 61   SpO2 96%   Review of Systems  Constitutional: Negative.   HENT: Negative.   Eyes: Negative.   Respiratory: Negative.   Cardiovascular: Negative.  Negative for chest pain and leg swelling.  Gastrointestinal: Negative.   Genitourinary: Negative.        Reports resolution of urinary symptoms  Musculoskeletal: Negative.  Negative for falls.  Skin: Negative.   Neurological: Negative.   Psychiatric/Behavioral: Negative.     Physical Exam  Constitutional: She is oriented to person, place, and time. Vital signs are normal. She appears well-developed and well-nourished. She is active. She does not have a sickly appearance. She does not appear ill.  Cardiovascular: Normal rate.  An irregular rhythm present.  Respiratory: Effort normal and breath sounds normal. No respiratory distress. She has no wheezes. She has no rhonchi. She has no rales.  GI: Soft. Bowel sounds are normal.  Neurological: She is  alert and oriented to person, place, and time.  Skin: Skin is warm, dry and intact.  Psychiatric: She has a normal mood and affect. Her speech is normal and behavior is normal. Judgment and thought content normal. Cognition and memory are normal.    Encounter Medications:   Outpatient Encounter Prescriptions as of 03/14/2016  Medication Sig Note  . ALPRAZolam (XANAX) 0.5 MG tablet 1 tablet 3 (three) times daily as needed.   Marland Kitchen. amLODipine (NORVASC) 5 MG tablet 1 tablet daily.   Marland Kitchen. CALCIUM PO Take 2 tablets by mouth daily.   . clopidogrel (PLAVIX) 75 MG tablet Take 1 tablet (75 mg total) by mouth daily.   . COMBIGAN 0.2-0.5 % ophthalmic solution Apply 1 drop to eye 2 (two) times daily.    Marland Kitchen. LUMIGAN 0.01 % SOLN Apply 1 drop to eye at bedtime.    . pantoprazole (PROTONIX) 40 MG tablet Take 40 mg by mouth 2 (two) times daily. Prescribed one tablet twice daily   . Probiotic Product (TRUBIOTICS PO) Take 1 tablet by mouth daily.    Assessment:  83 year old female living alone in [place] who had a hospitalization for encephalopathy and pneumonia at the end of last year and has care coordination needs related to fall risk and self health management.    Self Health Management Deficits - Tracey Walsh has ablood pressure monitor at home but has not been using it because she says she is unsure how to use it and it makes her nervous when she thinks about checking it. We got her monitor out on my first visit and practiced with it. I advised [name] to practice on her own at her convenience, to increase her comfort level with self monitoring. Tracey Walsh has health anxiety and admits that she is afraid to check her blood pressure because "it might be high". We have discussed that her blood pressure has been quite stable and it would be okay for her to let the doctor or nurse check it when she goes to an appointment. Otherwise, she can check it if she is not feeling well or is having new or unusual  symptoms.   Home Safety/Fall Risk- Tracey Walsh has abnormal balance and gait and has fallen 2 times in the last 3 months but moves about the house fairly easily with her walker. She cannot rise without use of her walker. She sometimes uses furniture for balance. She has carpet and area rugs throughout the house, contributing to increased fall risk. She also has glaucoma and lives alone.She is at high risk for continued falls. We have discussed home safety and fall risk reduction strategies.   Social/Home Management- Tracey Walsh gets meals on wheels deliveriesMonday thru Friday. She also has friends whoassist with meals. She has a daughter who lives out of town but sees her every several weeks &runs errands for her when she comes to visit. She [place] and [place]. She manages her own medications at home. Tracey Walsh appears to have a very strong social/family network and has a close friend Tracey Walsh who takes her to appointments, to the grocery store, and to her hair appointments.   Acute Health condition (UTI)/resolved - Tracey Walsh was taking Keflex for UTI and continued to have symptoms after she completed the course of antibiotics  We reached out to Tracey Walsh who prescribed another course of antibiotics which she will finish tomorrow and referred her to urology. Tracey Walsh has not heard from the urologist for an appointment. She says she has had urinary problems for years and keeps cranberry pills and AZO standard on hand. She currently is asymptomatic.   Plan:   I will update Tracey Walsh office re: Tracey Walsh not having heard from the urology office.   I anticipate that Tracey Walsh will be transitioned to telephonic case management for ongoing follow up over the next few months but I will continue to see her at home until we are sure her UTI symptoms are resolved and she feels more confident about self health management concerns.    Marja Kays MHA,BSN,RN,CCM [facility] Care Management  [phone]

## 2016-03-16 ENCOUNTER — Ambulatory Visit: Payer: PPO | Admitting: *Deleted

## 2016-03-24 ENCOUNTER — Other Ambulatory Visit: Payer: Self-pay | Admitting: *Deleted

## 2016-03-24 NOTE — Patient Outreach (Signed)
[facility]) Care Management  03/24/2016  Tracey Walsh [birth year] [id number]  Tracey Walsh is an 83 y.o. female with medical history of osteoarthritis, glaucoma, hearing loss, transient ischemic attack, hypertension, and pneumonia. Tracey Walsh was was hospitalized from 12/03/15-12/07/15 after she presented with altered speech pattern and was diagnosed with metabolic encephalopathy and pneumonia.   Tracey Walsh was last seen by Tracey Walsh, her primary care provider, on 02/29/16. Her friend Tracey Walsh [phone]) provides transportation to and from appointments. Tracey Walsh has asked me to communicate with Tracey Walsh about all our visits and especially about appointment scheduling.   I was contacted by Tracey Walsh and Tracey Walsh who notified me that Tracey Walsh has still not heard from the new urologist re: referral sent by Tracey Walsh' office. I reached out to Laren Boom at the office to notify her of the same.   Tracey Walsh otherwise reports that she is feeling well. She denies urinary symptoms. She is still "nervous" about checking her own blood pressure at home. She denies any respiratory symptoms or chest discomfort for which I saw her at home last week.   Plan: I will follow up with Tracey Walsh and Tracey Walsh when I hear from Tracey Walsh' office re: the urology referral.   [facility] CM Care Plan Problem One   Flowsheet Row Most Recent Value  Care Plan Problem One  Acute Health Condition (urinary symptoms)  Role Documenting the Problem One  Care Management Coordinator  Care Plan for Problem One  Active  [facility] Long Term Goal (31-90 days)  Over the next 31 days,, patient and/or caregiver will report understanding of plan of care for long term management of chronic health condition (UTI)  [facility]N Long Term Goal Start Date  03/02/16  Interventions for Problem One Long Term Goal  outreach to PCP re: urology referral  THN CM Short Term Goal #2 (0-30 days)  Over the next 30  days, patient/caregiver will verbalize early signs/symptoms of UTI and when to report   [facility] CM Short Term Goal #2 Start Date  03/02/16  Interventions for Short Term Goal #2  Utilizing teachback method, reviewed with patient/caregiver early signs and symptoms of UTI and when to report,  Emmi educational materials prescribed  THN CM Short Term Goal #3 (0-30 days)  Over the next 7 days, patient/caregiver will report receipt of new patient appointment with urologist  [facility] CM Short Term Goal #3 Start Date  03/24/16  Interventions for Short Tern Goal #3  outreach to PCP re: no urology referral outreach to patient    [facility] CM Care Plan Problem Two   Flowsheet Row Most Recent Value  Care Plan Problem Two  Self Health Monitoring and Management deficits  Role Documenting the Problem Two  Care Management Coordinator  Care Plan for Problem Two  Active  Interventions for Problem Two Long Term Goal   Discussed with Tracey Walsh the [employer] of self heatlh monitoring and mangaement for HTN,  using hands on demonstration, reviewed with patient proper technique for self blood pressure monitoring,  utilizing teachback method, reviewed with patient when to call provider to report signs and symptoms associated with hypertension  [facility]N Long Term Goal (31-90) days  Over the next 31 days, patient will verbalize understanding of plan for self health management for HTN management  THN Long Term Goal Start Date  03/14/16  THN CM Short Term Goal #1 (0-30 days)  Over the next 30 days, patient will practice  bp self monitoring at least 2 times weekly  THN CM Short Term Goal #1 Start Date    Interventions for Short Term Goal #2   reviewed use of home bp monitor and rationale for intermittent self bp checks       Marja Kayslisa Karna Abed MHA,BSN,RN,CCM [facility] Care Management  [phone]

## 2016-03-29 ENCOUNTER — Other Ambulatory Visit: Payer: Self-pay | Admitting: *Deleted

## 2016-03-29 NOTE — Patient Outreach (Signed)
[facility]N) Care Management  03/29/2016  Thomasena Edisileen O Torre 05-17-1924 [id number]  Marlana Salvageileen Isidoro is a 83 year old female with medical history of osteoarthritis, glaucoma, hearing loss, transient ischemic attack, hypertension, and pneumonia. [name] was was hospitalized from 12/03/15-12/07/15 after she presented with altered speech pattern and was diagnosed with metabolic encephalopathy and pneumonia.   Mrs. Cothron was last seen by Dr. Kari BaarsEdward Hawkins, her primary care provider, on 02/29/16. Her friend [name] ([phone])provides transportation to and from appointments. [name] has asked me to communicate with [name] about all our visits and especially about appointment scheduling.   I was contacted by [name] and [name] who notified me that [name] has still not heard from the new urologist re: referral sent by Dr. Juanetta GoslingHawkins' office. I reached out to [name] at the office to notify her of the same last week.   Mrs. Giannini took "[employer]" over the weekend for ongoing urinary symptoms and reports that this helped.    Plan: I reached out to Dr. Juanetta GoslingHawkins' office re: not having heard from urology. [name] stated she would check into the referral.   [name] is to call if she experiences new or worsened symptoms.    Marja Kayslisa Aftin Lye MHA,BSN,RN,CCM [facility] Care Management  [phone]

## 2016-04-04 ENCOUNTER — Other Ambulatory Visit: Payer: Self-pay | Admitting: *Deleted

## 2016-04-04 NOTE — Patient Outreach (Signed)
[facility]) Care Management  04/04/2016  Tracey Walsh 1924-02-27 [id number]  Follow up with Tracey Walsh today to inquire about receipt of urology referral and appointment. I confirmed with her and her caregiver Dawayne CirriJanet Walsh [phone] that she received a call from [facility] and has an appointment June 1.   Plan: I will discuss with Tracey Walsh and Dawayne CirriJanet Walsh next week any further needs Tracey Walsh my have. She is eligible for continued telephonic case management services but likely will no longer need in home/face to face visits.    Marja Kayslisa Gilboy MHA,BSN,RN,CCM [facility] Care Management  [phone]

## 2016-04-14 ENCOUNTER — Ambulatory Visit: Payer: Self-pay | Admitting: *Deleted

## 2016-04-25 ENCOUNTER — Other Ambulatory Visit: Payer: Self-pay | Admitting: *Deleted

## 2016-04-25 NOTE — Patient Outreach (Signed)
[facility]) Care Management  04/25/2016  Tracey Walsh May 25, 1924 [id number]  I spoke with Tracey Walsh today to follow up on her urinary symptoms and general progress. I also spoke with her caregiver Tracey Walsh. Tracey Walsh denies having any further urinary symptoms. She does have an appointment with the urologist in June and said she didn't feel she needed to go since she was better. We discussed how her urinary symptoms have been recurrent and I advised that she keep the urology appointment. She agreed.   Both Tracey Walsh and Tracey Walsh agree that Tracey Walsh continues to have balance and gait disturbances. She particularly has trouble when getting in and out of the bath/shower. She has a shower chair but easily loses balances when getting in/out. When I spoke with Tracey Walsh about this, she agreed to PT/OT evaluation and treatment at home but wanted to be sure to speak with someone at the home care agency about what her out of pocket expense would be prior to them starting.   Plan: I will follow up with Dr. Juanetta Gosling regarding PT/OT evaluations at home for Tracey Walsh and will follow up with her over the next few weeks.    Marja Kays MHA,BSN,RN,CCM [facility] Care Management  [phone]

## 2016-05-08 ENCOUNTER — Other Ambulatory Visit: Payer: Self-pay | Admitting: *Deleted

## 2016-05-08 NOTE — Patient Outreach (Signed)
[facility]) Care Management  05/08/2016  Tracey Walsh [birth year] [id number]  I reached out to [name] by phone today to follow up on PT/OT evaluation/recommendation. I requested that Dr. Luan Pulling' office send a referral/order as she continues to have balance and gait disturbances. She particularly has trouble when getting in and out of the bath/shower. She has a shower chair but easily loses balances when getting in/out. When I spoke with [name] about this, she agreed to PT/OT evaluation and treatment at home but wanted to be sure to speak with someone at the home care agency about what her out of pocket expense would be prior to them starting.   I was unable to reach her today on her home phone but her caregiver reports she has had some trouble with the home phone. I tried her on the mobile phone number she provided and was unable to reach her or leave a message.   Plan: I will reach out to [name] again within a week. I suspect she declined the PT/OT services if a copay was involved as she has done this in the past. When I next speak with [name], I will likely either transition her to a telephonic case manager or discharge as she has met all her current case management goals.    [place] Management  [phone]

## 2016-05-15 ENCOUNTER — Encounter: Payer: Self-pay | Admitting: *Deleted

## 2016-05-15 ENCOUNTER — Other Ambulatory Visit: Payer: Self-pay | Admitting: *Deleted

## 2016-05-15 NOTE — Patient Outreach (Signed)
[facility]) Care Management  05/15/2016  BLAKELEE ALLINGTON 11-Mar-1924 [id number]  I spoke with Mrs. Hali Marry and her primary caregiver Despina Pole today to assess Mrs. Hagenow's progress and needs. Mrs. Decamp has met all care management goals and her primary need is really custodial care. I arranged for Mrs. Abe to have a visit from several [employer] representatives to discuss options such as in home care and enrollment in the LEAF program. Mrs. Tedder does not qualify for Medicaid and does not wish to spend money out of pocket to cover the cost. She has family members who do not live in town and appear to be marginally involved with Mrs. Rhett's care needs. Mrs. Kumagai declined enrollment in the [facility]AF program where she could go at least twice weekly ( at no cost to her if spots are still available ) to have a meal and a bath/shower in the walk in shower under the supervision of an RN/CNA but she declined. Mrs. Lacinda Axon says she believes Mrs. Paladino would consider going to SNF or ALF but realizes she would likely have to sell her home and doesn't wish to do that at this time. For now, Mrs Lacinda Axon and several church members are coming by to see and take care of Mrs. Deans almost daily. Mrs. Felipa Furnace, and I discussed that there are no further case management interventions available at this time and given Mrs. Forsman's care needs are currently being met, despite the anticipated increase in needs over the coming months, I will discharge her from case management care at this time. Mrs. Micheline Chapman and Mrs. Lacinda Axon know how to get in touch with [place] should other/new needs arise in the future.   Plan: Case Closure.    [place] Management  [phone]

## 2016-05-23 DIAGNOSIS — K210 Gastro-esophageal reflux disease with esophagitis: Secondary | ICD-10-CM | POA: Diagnosis not present

## 2016-05-23 DIAGNOSIS — K219 Gastro-esophageal reflux disease without esophagitis: Secondary | ICD-10-CM | POA: Diagnosis not present

## 2016-05-23 DIAGNOSIS — I10 Essential (primary) hypertension: Secondary | ICD-10-CM | POA: Diagnosis not present

## 2016-05-23 DIAGNOSIS — E876 Hypokalemia: Secondary | ICD-10-CM | POA: Diagnosis not present

## 2016-05-23 DIAGNOSIS — F419 Anxiety disorder, unspecified: Secondary | ICD-10-CM | POA: Diagnosis not present

## 2016-05-23 DIAGNOSIS — N3281 Overactive bladder: Secondary | ICD-10-CM | POA: Diagnosis not present

## 2016-05-23 DIAGNOSIS — N321 Vesicointestinal fistula: Secondary | ICD-10-CM | POA: Diagnosis not present

## 2016-06-09 DIAGNOSIS — H6120 Impacted cerumen, unspecified ear: Secondary | ICD-10-CM | POA: Diagnosis not present

## 2016-06-16 ENCOUNTER — Ambulatory Visit (INDEPENDENT_AMBULATORY_CARE_PROVIDER_SITE_OTHER): Payer: PPO | Admitting: Urology

## 2016-06-16 ENCOUNTER — Other Ambulatory Visit (HOSPITAL_COMMUNITY)
Admission: RE | Admit: 2016-06-16 | Discharge: 2016-06-16 | Disposition: A | Discharge status: Home / Self Care | Payer: PPO | Source: Other Acute Inpatient Hospital | Attending: Urology | Admitting: Urology

## 2016-06-16 DIAGNOSIS — N952 Postmenopausal atrophic vaginitis: Secondary | ICD-10-CM

## 2016-06-16 DIAGNOSIS — N3020 Other chronic cystitis without hematuria: Secondary | ICD-10-CM | POA: Diagnosis not present

## 2016-06-16 LAB — URINALYSIS, COMPLETE (UACMP) WITH MICROSCOPIC
BILIRUBIN URINE: NEGATIVE
Glucose, UA: NEGATIVE mg/dL
Hgb urine dipstick: NEGATIVE
Ketones, ur: NEGATIVE mg/dL
Nitrite: NEGATIVE
PROTEIN: NEGATIVE mg/dL
SPECIFIC GRAVITY, URINE: 1.017 (ref 1.005–1.030)
pH: 5 (ref 5.0–8.0)

## 2016-06-19 LAB — URINE CULTURE

## 2016-06-20 ENCOUNTER — Other Ambulatory Visit: Payer: Self-pay | Admitting: Urology

## 2016-06-20 DIAGNOSIS — N3020 Other chronic cystitis without hematuria: Secondary | ICD-10-CM

## 2016-07-04 ENCOUNTER — Ambulatory Visit (HOSPITAL_COMMUNITY)
Admission: RE | Admit: 2016-07-04 | Discharge: 2016-07-04 | Disposition: A | Discharge status: Home / Self Care | Payer: PPO | Source: Ambulatory Visit | Attending: Urology | Admitting: Urology

## 2016-07-04 DIAGNOSIS — N3020 Other chronic cystitis without hematuria: Secondary | ICD-10-CM | POA: Diagnosis not present

## 2016-07-04 DIAGNOSIS — I10 Essential (primary) hypertension: Secondary | ICD-10-CM | POA: Diagnosis not present

## 2016-09-07 DIAGNOSIS — M159 Polyosteoarthritis, unspecified: Secondary | ICD-10-CM | POA: Diagnosis not present

## 2016-09-07 DIAGNOSIS — I10 Essential (primary) hypertension: Secondary | ICD-10-CM | POA: Diagnosis not present

## 2016-09-07 DIAGNOSIS — F419 Anxiety disorder, unspecified: Secondary | ICD-10-CM | POA: Diagnosis not present

## 2016-09-07 DIAGNOSIS — N3281 Overactive bladder: Secondary | ICD-10-CM | POA: Diagnosis not present

## 2016-12-01 DIAGNOSIS — H401132 Primary open-angle glaucoma, bilateral, moderate stage: Secondary | ICD-10-CM | POA: Diagnosis not present

## 2016-12-12 DIAGNOSIS — Z23 Encounter for immunization: Secondary | ICD-10-CM | POA: Diagnosis not present

## 2016-12-12 DIAGNOSIS — Z0000 Encounter for general adult medical examination without abnormal findings: Secondary | ICD-10-CM | POA: Diagnosis not present

## 2016-12-15 ENCOUNTER — Ambulatory Visit (INDEPENDENT_AMBULATORY_CARE_PROVIDER_SITE_OTHER): Payer: PPO | Admitting: Urology

## 2016-12-15 DIAGNOSIS — R350 Frequency of micturition: Secondary | ICD-10-CM

## 2016-12-18 DIAGNOSIS — H409 Unspecified glaucoma: Secondary | ICD-10-CM | POA: Diagnosis not present

## 2016-12-18 DIAGNOSIS — F419 Anxiety disorder, unspecified: Secondary | ICD-10-CM | POA: Diagnosis not present

## 2016-12-18 DIAGNOSIS — K219 Gastro-esophageal reflux disease without esophagitis: Secondary | ICD-10-CM | POA: Diagnosis not present

## 2016-12-18 DIAGNOSIS — I10 Essential (primary) hypertension: Secondary | ICD-10-CM | POA: Diagnosis not present

## 2017-01-06 IMAGING — DX DG CHEST 2V
2 series · 2 of 2 positions shown · non-contrast
Comparison: Chest x-ray of October 19, 2015 and November 28, 2012.

CLINICAL DATA: Intermittent cough with nausea and vomiting today.

EXAM:
CHEST  2 VIEW

[chest lat]
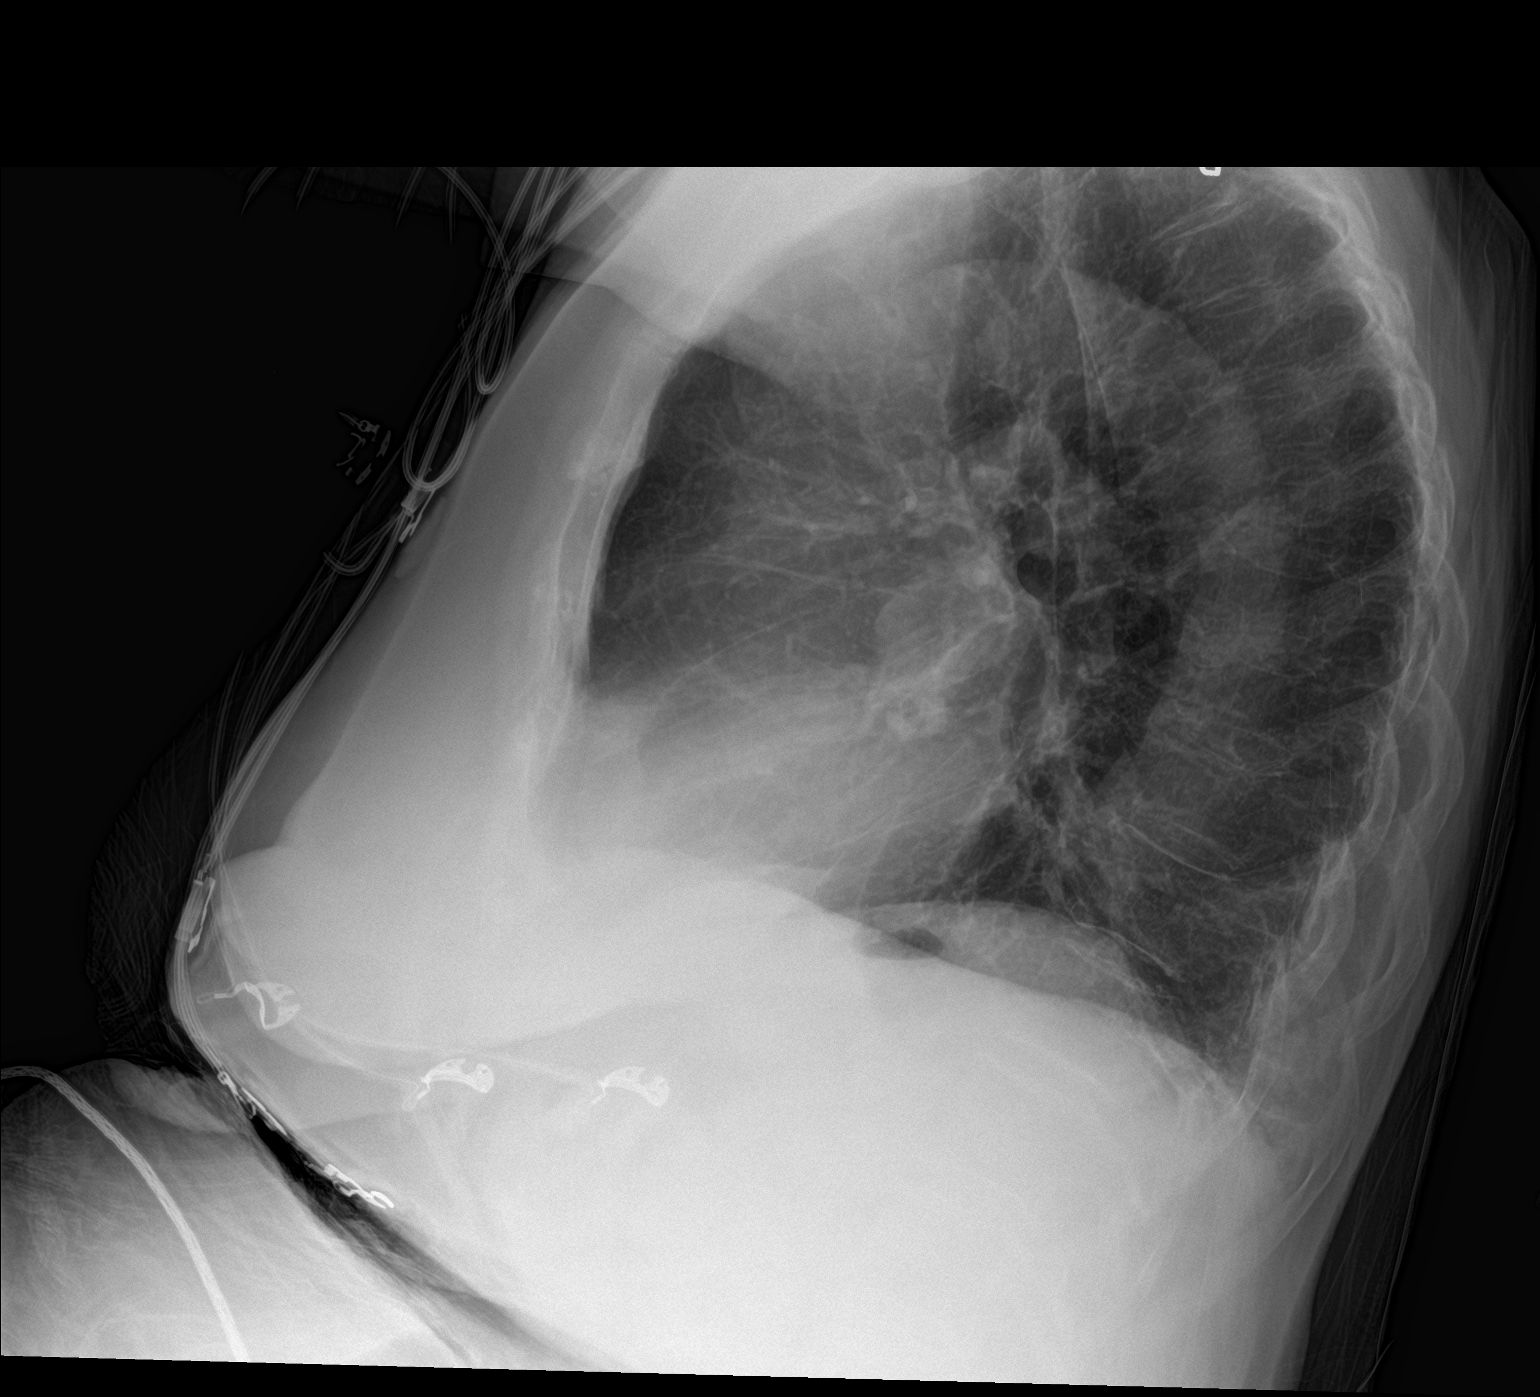

[chest ap]
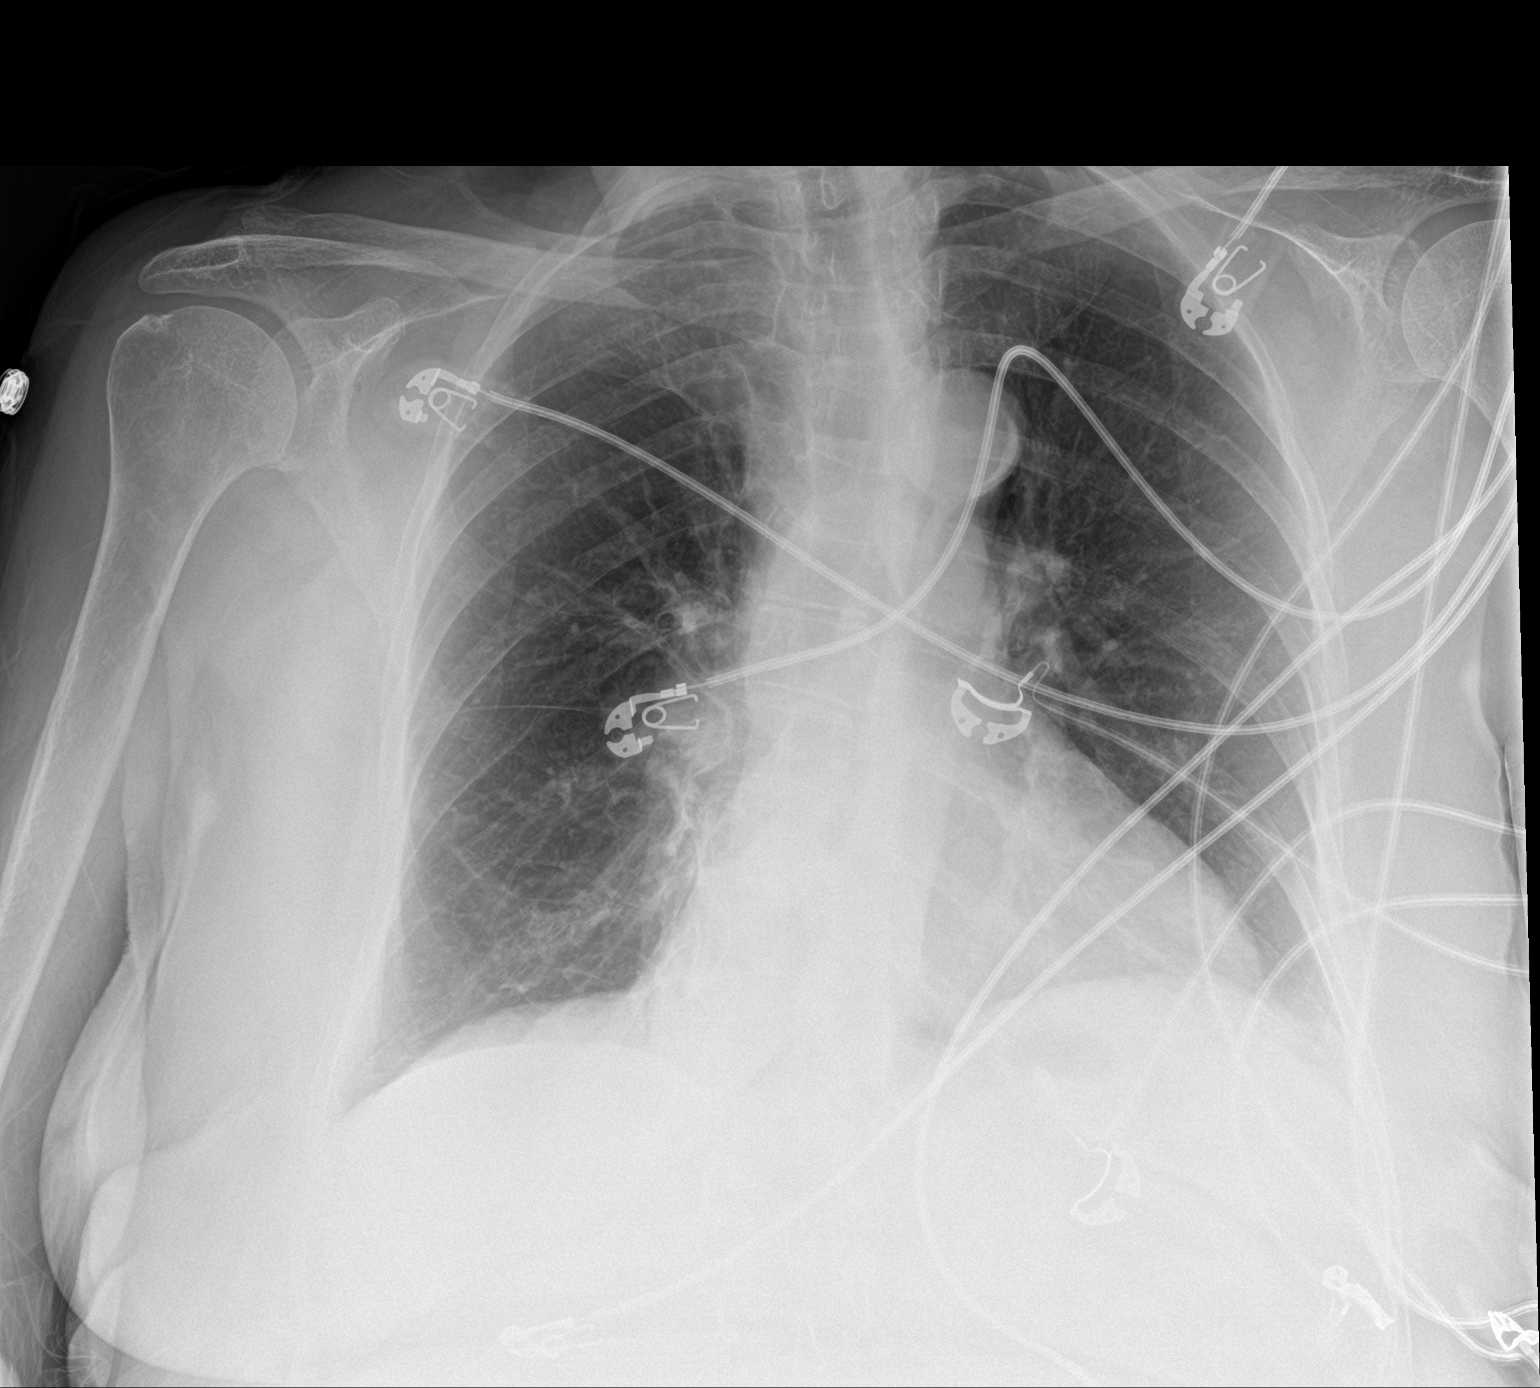

[2 of 2 positions shown; findings below may reference images not displayed]

FINDINGS: The lungs are well-expanded. There is increased density overlying
the cardiac silhouette which is likely in the lingula. The heart and
pulmonary vascularity are normal. There is calcification in the wall
of the aortic arch. There is no pleural effusion or pneumothorax.
There is mild dextrocurvature centered at the thoracolumbar
junction.
IMPRESSION: COPD. Lingular atelectasis or pneumonia. Followup PA and lateral
chest X-ray is recommended in 3-4 weeks following trial of
antibiotic therapy to ensure resolution and exclude underlying
malignancy.

Thoracic aortic atherosclerosis.

## 2017-01-06 IMAGING — DX DG ABDOMEN 1V
1 series · 1 of 1 positions shown · non-contrast
Comparison: None.

CLINICAL DATA: Nausea and vomiting x3 today.

EXAM:
ABDOMEN - 1 VIEW

[abdomen kub]
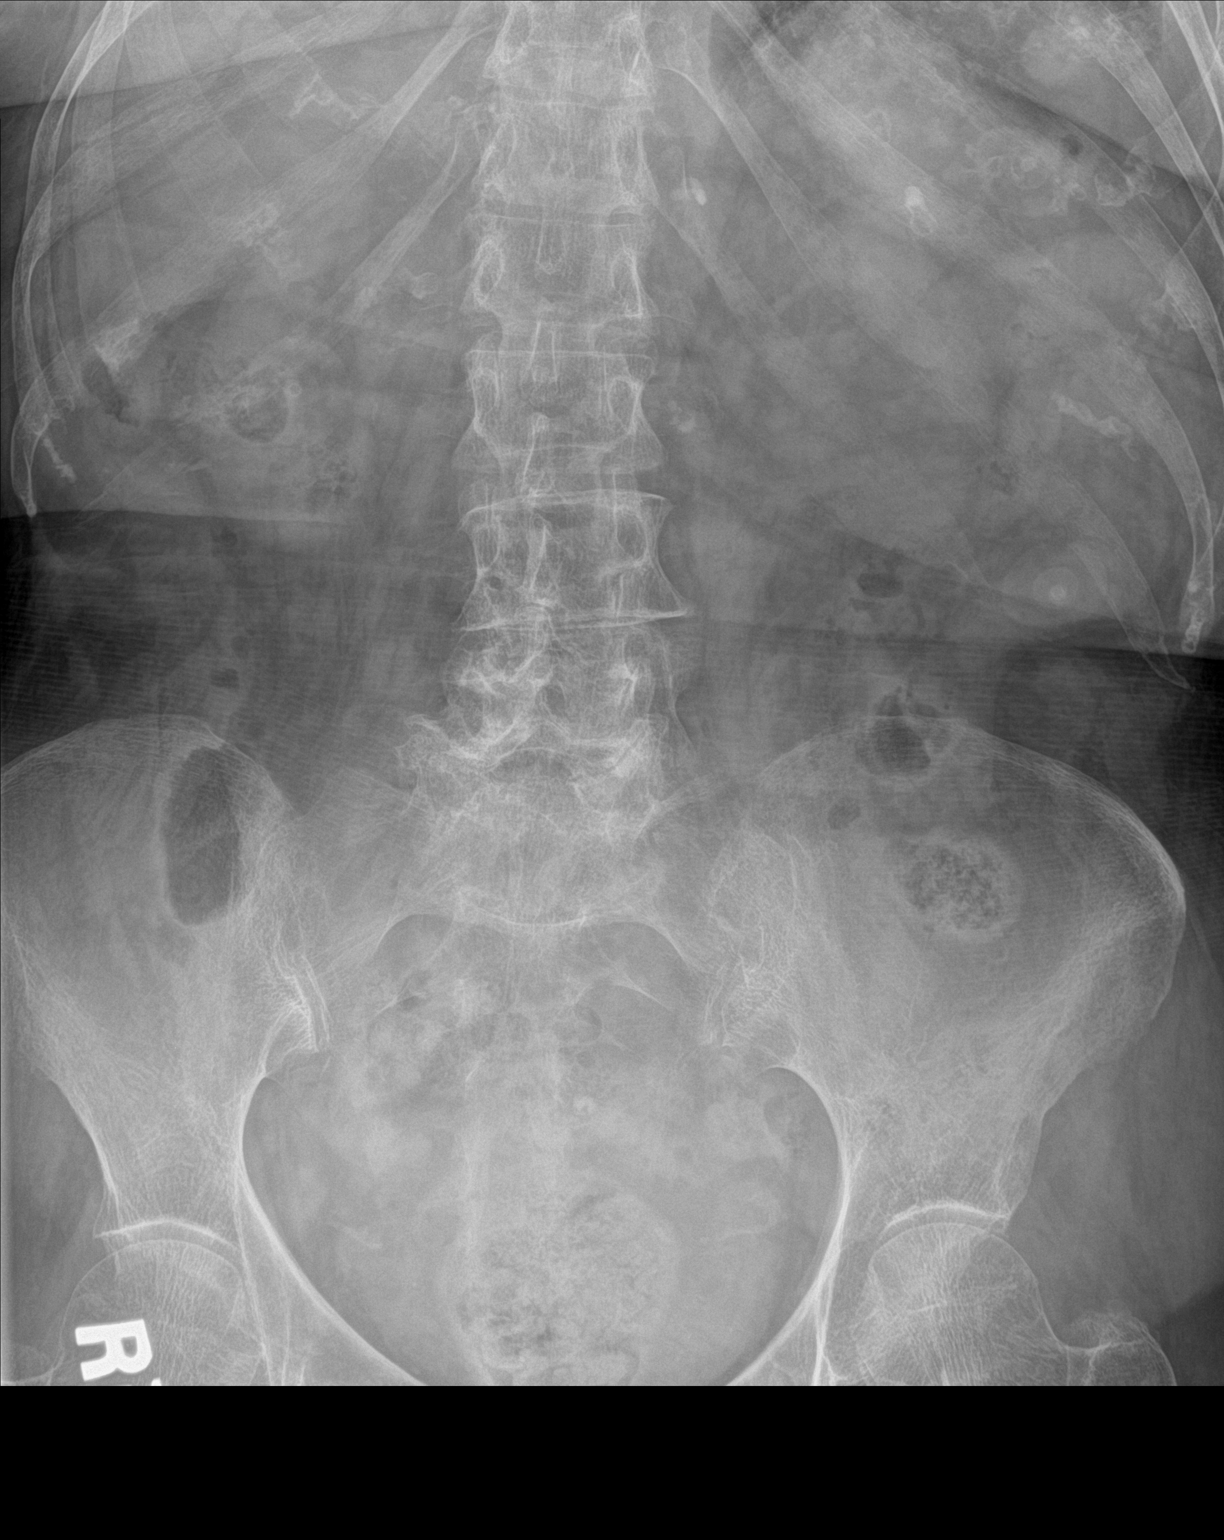

[1 of 1 positions shown; findings below may reference images not displayed]

FINDINGS: Single supine view the abdomen and pelvis. Non-obstructive bowel gas
pattern. No abnormal abdominal calcifications. No appendicolith.
Moderate amount of stool in the rectum. Mild osteopenia.
IMPRESSION: No acute findings.

## 2017-01-19 ENCOUNTER — Ambulatory Visit (INDEPENDENT_AMBULATORY_CARE_PROVIDER_SITE_OTHER): Payer: PPO | Admitting: Urology

## 2017-01-19 ENCOUNTER — Other Ambulatory Visit (HOSPITAL_COMMUNITY)
Admission: RE | Admit: 2017-01-19 | Discharge: 2017-01-19 | Disposition: A | Discharge status: Home / Self Care | Payer: PPO | Source: Other Acute Inpatient Hospital | Attending: Urology | Admitting: Urology

## 2017-01-19 DIAGNOSIS — R350 Frequency of micturition: Secondary | ICD-10-CM | POA: Diagnosis not present

## 2017-01-19 DIAGNOSIS — N3020 Other chronic cystitis without hematuria: Secondary | ICD-10-CM | POA: Insufficient documentation

## 2017-01-19 LAB — URINALYSIS, COMPLETE (UACMP) WITH MICROSCOPIC
BILIRUBIN URINE: NEGATIVE
Glucose, UA: NEGATIVE mg/dL
Hgb urine dipstick: NEGATIVE
Ketones, ur: NEGATIVE mg/dL
Nitrite: POSITIVE — AB
PH: 5 (ref 5.0–8.0)
Protein, ur: NEGATIVE mg/dL
SPECIFIC GRAVITY, URINE: 1.018 (ref 1.005–1.030)

## 2017-01-22 LAB — URINE CULTURE: Culture: >=100000 — AB

## 2017-03-08 DIAGNOSIS — N3281 Overactive bladder: Secondary | ICD-10-CM | POA: Diagnosis not present

## 2017-03-08 DIAGNOSIS — K210 Gastro-esophageal reflux disease with esophagitis: Secondary | ICD-10-CM | POA: Diagnosis not present

## 2017-03-08 DIAGNOSIS — I10 Essential (primary) hypertension: Secondary | ICD-10-CM | POA: Diagnosis not present

## 2017-03-08 DIAGNOSIS — F419 Anxiety disorder, unspecified: Secondary | ICD-10-CM | POA: Diagnosis not present

## 2017-04-17 DIAGNOSIS — N3281 Overactive bladder: Secondary | ICD-10-CM | POA: Diagnosis not present

## 2017-04-17 DIAGNOSIS — I10 Essential (primary) hypertension: Secondary | ICD-10-CM | POA: Diagnosis not present

## 2017-04-17 DIAGNOSIS — R21 Rash and other nonspecific skin eruption: Secondary | ICD-10-CM | POA: Diagnosis not present

## 2017-04-17 DIAGNOSIS — F419 Anxiety disorder, unspecified: Secondary | ICD-10-CM | POA: Diagnosis not present

## 2017-04-27 ENCOUNTER — Ambulatory Visit (INDEPENDENT_AMBULATORY_CARE_PROVIDER_SITE_OTHER): Payer: PPO | Admitting: Urology

## 2017-04-27 ENCOUNTER — Other Ambulatory Visit (HOSPITAL_COMMUNITY)
Admission: AD | Admit: 2017-04-27 | Discharge: 2017-04-27 | Disposition: A | Discharge status: Home / Self Care | Payer: PPO | Source: Other Acute Inpatient Hospital | Attending: Urology | Admitting: Urology

## 2017-04-27 DIAGNOSIS — N3020 Other chronic cystitis without hematuria: Secondary | ICD-10-CM | POA: Diagnosis not present

## 2017-04-27 DIAGNOSIS — R350 Frequency of micturition: Secondary | ICD-10-CM | POA: Diagnosis not present

## 2017-04-27 LAB — URINALYSIS, COMPLETE (UACMP) WITH MICROSCOPIC
Bilirubin Urine: NEGATIVE
Glucose, UA: NEGATIVE mg/dL
Hgb urine dipstick: NEGATIVE
KETONES UR: NEGATIVE mg/dL
Nitrite: NEGATIVE
Protein, ur: NEGATIVE mg/dL
Specific Gravity, Urine: 1.011 (ref 1.005–1.030)
pH: 5 (ref 5.0–8.0)

## 2017-04-30 LAB — URINE CULTURE: Culture: >=100000 — AB

## 2017-06-05 DIAGNOSIS — I10 Essential (primary) hypertension: Secondary | ICD-10-CM | POA: Diagnosis not present

## 2017-06-05 DIAGNOSIS — W19XXXA Unspecified fall, initial encounter: Secondary | ICD-10-CM | POA: Diagnosis not present

## 2017-06-05 DIAGNOSIS — K219 Gastro-esophageal reflux disease without esophagitis: Secondary | ICD-10-CM | POA: Diagnosis not present

## 2017-06-05 DIAGNOSIS — M159 Polyosteoarthritis, unspecified: Secondary | ICD-10-CM | POA: Diagnosis not present

## 2017-06-08 DIAGNOSIS — H401132 Primary open-angle glaucoma, bilateral, moderate stage: Secondary | ICD-10-CM | POA: Diagnosis not present

## 2017-06-15 DIAGNOSIS — N3281 Overactive bladder: Secondary | ICD-10-CM | POA: Diagnosis not present

## 2017-06-15 DIAGNOSIS — M159 Polyosteoarthritis, unspecified: Secondary | ICD-10-CM | POA: Diagnosis not present

## 2017-06-15 DIAGNOSIS — Z87440 Personal history of urinary (tract) infections: Secondary | ICD-10-CM | POA: Diagnosis not present

## 2017-06-15 DIAGNOSIS — F419 Anxiety disorder, unspecified: Secondary | ICD-10-CM | POA: Diagnosis not present

## 2017-06-15 DIAGNOSIS — M810 Age-related osteoporosis without current pathological fracture: Secondary | ICD-10-CM | POA: Diagnosis not present

## 2017-06-15 DIAGNOSIS — I10 Essential (primary) hypertension: Secondary | ICD-10-CM | POA: Diagnosis not present

## 2017-06-15 DIAGNOSIS — H409 Unspecified glaucoma: Secondary | ICD-10-CM | POA: Diagnosis not present

## 2017-06-15 DIAGNOSIS — Z9181 History of falling: Secondary | ICD-10-CM | POA: Diagnosis not present

## 2017-07-16 ENCOUNTER — Other Ambulatory Visit: Payer: Self-pay | Admitting: *Deleted

## 2017-07-16 NOTE — Patient Outreach (Signed)
[facility]) Care Management  07/16/2017  Tracey Walsh Aug 06, 1924 [id number]  [facility] via [facility] Department-Tracey Walsh: Reason: Per Medical Director review=" her needs appear primarily custodial based on a more or less permanent decline. She appears to be very tenuous, even potentially unsafe at home alone in her current situation..   Telephone call to patient who was advised of reason for call and of [facility] care management services.  Hippa verification received from patient .  Patient voices that she lives alone but has friends & family that help her. States her friend takes her to all of her MD appointments. Voices that her daughter lives in [place] and  comes to check on her every 2-3 weeks.  States she her daughter & friend buy groceries for her,   Patient voices that she uses meals of wheels and gets hot meal once a day during the week days. States she fixes something simple for her breakfast and supper. States she does not cook meals but Walsh use microwave to heat up things. States friends often bring her meals.    Patient states she uses walker to get around and when going out or to MD office she uses wheelchair. Voices her medications are delivered to her home. States she manages her medications and takes as prescribed by her MD.   States major health conditions are arthritis, glaucoma, anxiety, hx mini stroke, (states BP under control) and she has trouble with knee. States she takes tylenol if having pain.   Patient states she plans to stay in her home as long as she Walsh. States she and daughter have talked about long range planning and she will go live with her daughter in [place] she when she is no longer able to live alone and care for herself.   Patient states she would be interested in having help with getting bath maybe several times a week but not sure if she could afford.   Patient [facility] of [facility] services. States she is only  interested in Child psychotherapistocial Worker services at this time.  Plan: Referral to Clinical Social Worker community resources for personal  care services. Telephonic signing off.   Tracey CanLinda Ameilia Rattan, RN BSN CCM Care Management Coordinator [facility] Care Management  [phone]

## 2017-07-20 ENCOUNTER — Other Ambulatory Visit: Payer: Self-pay

## 2017-07-20 NOTE — Patient Outreach (Signed)
[facility]) Care Management  07/20/2017  Tracey Walsh 16-Aug-1924 [id number]  First outreach attempt to [name] regarding social work referral.  Nurse, learning disabilityBSW left voicemail message.  Will make second attempt to contact within four business days.  Malachy ChamberAmber Davinity Fanara, BSW Social Worker [phone]

## 2017-07-26 ENCOUNTER — Ambulatory Visit: Payer: Self-pay

## 2017-07-26 NOTE — Patient Outreach (Signed)
[facility]) Care Management  07/26/2017  Thomasena Edisileen O Lawyer September 10, 1924 [id number]   Second outreach attempt to Ms. Elizardo regarding social work referral.  Nurse, learning disabilityBSW left voicemail message.  Also contacted her daughter, Zoila ShutterMary Jane Ourada, who asked me to call back after 4:00 pm.  Called back and left a voicemail message.  Will try again within four business days.   Malachy ChamberAmber Fordyce Lepak, BSW Social Worker [phone]

## 2017-07-27 ENCOUNTER — Ambulatory Visit (INDEPENDENT_AMBULATORY_CARE_PROVIDER_SITE_OTHER): Payer: PPO | Admitting: Urology

## 2017-07-27 ENCOUNTER — Other Ambulatory Visit (HOSPITAL_COMMUNITY)
Admission: RE | Admit: 2017-07-27 | Discharge: 2017-07-27 | Disposition: A | Discharge status: Home / Self Care | Payer: PPO | Source: Other Acute Inpatient Hospital | Attending: Urology | Admitting: Urology

## 2017-07-27 DIAGNOSIS — R350 Frequency of micturition: Secondary | ICD-10-CM | POA: Diagnosis not present

## 2017-07-27 DIAGNOSIS — N3020 Other chronic cystitis without hematuria: Secondary | ICD-10-CM | POA: Diagnosis not present

## 2017-07-27 DIAGNOSIS — R4182 Altered mental status, unspecified: Secondary | ICD-10-CM | POA: Diagnosis not present

## 2017-07-29 LAB — URINE CULTURE

## 2017-07-30 ENCOUNTER — Other Ambulatory Visit: Payer: Self-pay

## 2017-07-30 NOTE — Patient Outreach (Signed)
[facility]) Care Management  07/27/2017  Tracey Walsh Oct 28, 1924 [id number]    BSW received return phone call from patient's daughter, Tracey Walsh.  Ms. Tracey Walsh reported that there are some friends and family members currently assisting patient with light housekeeping and meal preparation.  She expressed interest in an aide that could come give her a bath at least a couple of times per week.  BSW informed her that the only option for those services, based on patient's current insurance coverage, would be private pay.  BSW talked with her about services that would be available if patient qualified for Medicaid.  She was unsure that patient will qualify but said that she would like to apply. BSW talked with hear about [facility] Services through [employer] [facility] [employer] and informed her that they will not assist with bathing but can assist with cleaning, meal preparation and transportation.  BSW agreed to [place] application and brochure on [employer].  BSW will follow up by the end of the week to ensure receipt.   Malachy ChamberAmber Carthel Castille, BSW Social Worker [phone]

## 2017-07-31 ENCOUNTER — Ambulatory Visit: Payer: Self-pay

## 2017-08-03 ENCOUNTER — Other Ambulatory Visit: Payer: Self-pay

## 2017-08-03 NOTE — Patient Outreach (Signed)
[facility]) Care Management  08/03/2017  Tracey Walsh 1924-03-26 [id number]  Follow up call to patient's daughter, Tracey Walsh, to ensure receipt of documents mailed last week.  Tracey Walsh confirmed receipt and said that she will be visiting her mom next weekend and can assist with the Medicaid application at that time.  BSW is closing case due to no other social work needs being identified at this time.  Malachy ChamberAmber Roc Street[address], BSW Social Worker [phone]

## 2017-09-05 DIAGNOSIS — G459 Transient cerebral ischemic attack, unspecified: Secondary | ICD-10-CM | POA: Diagnosis not present

## 2017-09-05 DIAGNOSIS — N3289 Other specified disorders of bladder: Secondary | ICD-10-CM | POA: Diagnosis not present

## 2017-09-05 DIAGNOSIS — M159 Polyosteoarthritis, unspecified: Secondary | ICD-10-CM | POA: Diagnosis not present

## 2017-09-05 DIAGNOSIS — I10 Essential (primary) hypertension: Secondary | ICD-10-CM | POA: Diagnosis not present

## 2017-09-25 ENCOUNTER — Other Ambulatory Visit (HOSPITAL_COMMUNITY)
Admission: RE | Admit: 2017-09-25 | Discharge: 2017-09-25 | Disposition: A | Discharge status: Home / Self Care | Payer: PPO | Source: Ambulatory Visit | Attending: Urology | Admitting: Urology

## 2017-09-25 ENCOUNTER — Ambulatory Visit (INDEPENDENT_AMBULATORY_CARE_PROVIDER_SITE_OTHER): Payer: PPO | Admitting: Urology

## 2017-09-25 DIAGNOSIS — N3000 Acute cystitis without hematuria: Secondary | ICD-10-CM | POA: Insufficient documentation

## 2017-09-28 LAB — URINE CULTURE: Culture: >=100000 — AB

## 2017-10-03 DIAGNOSIS — Z87440 Personal history of urinary (tract) infections: Secondary | ICD-10-CM | POA: Diagnosis not present

## 2017-10-03 DIAGNOSIS — N390 Urinary tract infection, site not specified: Secondary | ICD-10-CM | POA: Diagnosis not present

## 2017-10-03 DIAGNOSIS — R350 Frequency of micturition: Secondary | ICD-10-CM | POA: Diagnosis not present

## 2017-10-03 DIAGNOSIS — N3020 Other chronic cystitis without hematuria: Secondary | ICD-10-CM | POA: Diagnosis not present

## 2017-10-04 DIAGNOSIS — N3020 Other chronic cystitis without hematuria: Secondary | ICD-10-CM | POA: Diagnosis not present

## 2017-10-04 DIAGNOSIS — N390 Urinary tract infection, site not specified: Secondary | ICD-10-CM | POA: Diagnosis not present

## 2017-10-04 DIAGNOSIS — R350 Frequency of micturition: Secondary | ICD-10-CM | POA: Diagnosis not present

## 2017-10-04 DIAGNOSIS — Z87440 Personal history of urinary (tract) infections: Secondary | ICD-10-CM | POA: Diagnosis not present

## 2017-10-05 DIAGNOSIS — N3020 Other chronic cystitis without hematuria: Secondary | ICD-10-CM | POA: Diagnosis not present

## 2017-10-05 DIAGNOSIS — Z87440 Personal history of urinary (tract) infections: Secondary | ICD-10-CM | POA: Diagnosis not present

## 2017-10-05 DIAGNOSIS — N390 Urinary tract infection, site not specified: Secondary | ICD-10-CM | POA: Diagnosis not present

## 2017-10-05 DIAGNOSIS — R350 Frequency of micturition: Secondary | ICD-10-CM | POA: Diagnosis not present

## 2017-10-06 DIAGNOSIS — Z87440 Personal history of urinary (tract) infections: Secondary | ICD-10-CM | POA: Diagnosis not present

## 2017-10-06 DIAGNOSIS — N3020 Other chronic cystitis without hematuria: Secondary | ICD-10-CM | POA: Diagnosis not present

## 2017-10-06 DIAGNOSIS — R350 Frequency of micturition: Secondary | ICD-10-CM | POA: Diagnosis not present

## 2017-10-06 DIAGNOSIS — N390 Urinary tract infection, site not specified: Secondary | ICD-10-CM | POA: Diagnosis not present

## 2017-10-07 DIAGNOSIS — N390 Urinary tract infection, site not specified: Secondary | ICD-10-CM | POA: Diagnosis not present

## 2017-10-07 DIAGNOSIS — Z87440 Personal history of urinary (tract) infections: Secondary | ICD-10-CM | POA: Diagnosis not present

## 2017-10-07 DIAGNOSIS — N3020 Other chronic cystitis without hematuria: Secondary | ICD-10-CM | POA: Diagnosis not present

## 2017-10-07 DIAGNOSIS — R350 Frequency of micturition: Secondary | ICD-10-CM | POA: Diagnosis not present

## 2017-10-08 ENCOUNTER — Other Ambulatory Visit (HOSPITAL_COMMUNITY)
Admission: AD | Admit: 2017-10-08 | Discharge: 2017-10-08 | Disposition: A | Discharge status: Home / Self Care | Payer: PPO | Source: Skilled Nursing Facility | Attending: Urology | Admitting: Urology

## 2017-10-08 DIAGNOSIS — R350 Frequency of micturition: Secondary | ICD-10-CM | POA: Insufficient documentation

## 2017-10-08 DIAGNOSIS — Z87440 Personal history of urinary (tract) infections: Secondary | ICD-10-CM | POA: Diagnosis not present

## 2017-10-08 DIAGNOSIS — N3020 Other chronic cystitis without hematuria: Secondary | ICD-10-CM | POA: Diagnosis not present

## 2017-10-08 DIAGNOSIS — N390 Urinary tract infection, site not specified: Secondary | ICD-10-CM | POA: Insufficient documentation

## 2017-10-08 LAB — COMPREHENSIVE METABOLIC PANEL
ALK PHOS: 59 U/L (ref 38–126)
ALT: 11 U/L (ref 0–44)
AST: 18 U/L (ref 15–41)
Albumin: 3.5 g/dL (ref 3.5–5.0)
Anion gap: 10 (ref 5–15)
BUN: 23 mg/dL (ref 8–23)
CALCIUM: 9 mg/dL (ref 8.9–10.3)
CO2: 25 mmol/L (ref 22–32)
CREATININE: 1.05 mg/dL — AB (ref 0.44–1.00)
Chloride: 107 mmol/L (ref 98–111)
GFR calc non Af Amer: 45 mL/min — ABNORMAL LOW (ref >60)
GFR, EST AFRICAN AMERICAN: 52 mL/min — AB (ref >60)
GLUCOSE: 97 mg/dL (ref 70–99)
Potassium: 4.1 mmol/L (ref 3.5–5.1)
Sodium: 142 mmol/L (ref 135–145)
Total Bilirubin: 0.7 mg/dL (ref 0.3–1.2)
Total Protein: 5.8 g/dL — ABNORMAL LOW (ref 6.5–8.1)

## 2017-10-08 LAB — CBC WITH DIFFERENTIAL/PLATELET
Basophils Absolute: 0 10*3/uL (ref 0.0–0.1)
Basophils Relative: 1 %
Eosinophils Absolute: 0.2 10*3/uL (ref 0.0–0.7)
Eosinophils Relative: 4 %
HEMATOCRIT: 41.2 % (ref 36.0–46.0)
HEMOGLOBIN: 13.4 g/dL (ref 12.0–15.0)
LYMPHS ABS: 2.4 10*3/uL (ref 0.7–4.0)
Lymphocytes Relative: 41 %
MCH: 31.2 pg (ref 26.0–34.0)
MCHC: 32.5 g/dL (ref 30.0–36.0)
MCV: 96 fL (ref 78.0–100.0)
MONOS PCT: 11 %
Monocytes Absolute: 0.6 10*3/uL (ref 0.1–1.0)
NEUTROS ABS: 2.5 10*3/uL (ref 1.7–7.7)
Neutrophils Relative %: 43 %
Platelets: 204 10*3/uL (ref 150–400)
RBC: 4.29 MIL/uL (ref 3.87–5.11)
RDW: 13 % (ref 11.5–15.5)
WBC: 5.8 10*3/uL (ref 4.0–10.5)

## 2017-10-08 LAB — SEDIMENTATION RATE: Sed Rate: 7 mm/hr (ref 0–22)

## 2017-10-09 DIAGNOSIS — Z87440 Personal history of urinary (tract) infections: Secondary | ICD-10-CM | POA: Diagnosis not present

## 2017-10-09 DIAGNOSIS — R350 Frequency of micturition: Secondary | ICD-10-CM | POA: Diagnosis not present

## 2017-10-09 DIAGNOSIS — N390 Urinary tract infection, site not specified: Secondary | ICD-10-CM | POA: Diagnosis not present

## 2017-10-09 DIAGNOSIS — N3020 Other chronic cystitis without hematuria: Secondary | ICD-10-CM | POA: Diagnosis not present

## 2017-10-20 DIAGNOSIS — H409 Unspecified glaucoma: Secondary | ICD-10-CM | POA: Diagnosis not present

## 2017-10-20 DIAGNOSIS — Z7902 Long term (current) use of antithrombotics/antiplatelets: Secondary | ICD-10-CM | POA: Diagnosis not present

## 2017-10-20 DIAGNOSIS — H2510 Age-related nuclear cataract, unspecified eye: Secondary | ICD-10-CM | POA: Diagnosis not present

## 2017-10-20 DIAGNOSIS — Z87440 Personal history of urinary (tract) infections: Secondary | ICD-10-CM | POA: Diagnosis not present

## 2017-10-20 DIAGNOSIS — I10 Essential (primary) hypertension: Secondary | ICD-10-CM | POA: Diagnosis not present

## 2017-10-20 DIAGNOSIS — Z7982 Long term (current) use of aspirin: Secondary | ICD-10-CM | POA: Diagnosis not present

## 2017-10-20 DIAGNOSIS — M159 Polyosteoarthritis, unspecified: Secondary | ICD-10-CM | POA: Diagnosis not present

## 2017-10-20 DIAGNOSIS — M810 Age-related osteoporosis without current pathological fracture: Secondary | ICD-10-CM | POA: Diagnosis not present

## 2017-10-20 DIAGNOSIS — Z8673 Personal history of transient ischemic attack (TIA), and cerebral infarction without residual deficits: Secondary | ICD-10-CM | POA: Diagnosis not present

## 2017-10-20 DIAGNOSIS — Z9181 History of falling: Secondary | ICD-10-CM | POA: Diagnosis not present

## 2017-10-20 DIAGNOSIS — N3281 Overactive bladder: Secondary | ICD-10-CM | POA: Diagnosis not present

## 2017-10-20 DIAGNOSIS — K219 Gastro-esophageal reflux disease without esophagitis: Secondary | ICD-10-CM | POA: Diagnosis not present

## 2017-10-20 DIAGNOSIS — R350 Frequency of micturition: Secondary | ICD-10-CM | POA: Diagnosis not present

## 2017-10-20 DIAGNOSIS — K589 Irritable bowel syndrome without diarrhea: Secondary | ICD-10-CM | POA: Diagnosis not present

## 2017-10-20 DIAGNOSIS — F419 Anxiety disorder, unspecified: Secondary | ICD-10-CM | POA: Diagnosis not present

## 2017-10-26 ENCOUNTER — Ambulatory Visit (INDEPENDENT_AMBULATORY_CARE_PROVIDER_SITE_OTHER): Payer: PPO | Admitting: Urology

## 2017-10-26 DIAGNOSIS — N952 Postmenopausal atrophic vaginitis: Secondary | ICD-10-CM | POA: Diagnosis not present

## 2017-10-26 DIAGNOSIS — N3020 Other chronic cystitis without hematuria: Secondary | ICD-10-CM | POA: Diagnosis not present

## 2018-02-01 ENCOUNTER — Ambulatory Visit (INDEPENDENT_AMBULATORY_CARE_PROVIDER_SITE_OTHER): Payer: PPO | Admitting: Urology

## 2018-02-01 DIAGNOSIS — R350 Frequency of micturition: Secondary | ICD-10-CM | POA: Diagnosis not present

## 2018-02-01 DIAGNOSIS — N952 Postmenopausal atrophic vaginitis: Secondary | ICD-10-CM | POA: Diagnosis not present

## 2018-02-01 DIAGNOSIS — N3020 Other chronic cystitis without hematuria: Secondary | ICD-10-CM | POA: Diagnosis not present

## 2018-02-08 DIAGNOSIS — Z23 Encounter for immunization: Secondary | ICD-10-CM | POA: Diagnosis not present

## 2018-02-08 DIAGNOSIS — F419 Anxiety disorder, unspecified: Secondary | ICD-10-CM | POA: Diagnosis not present

## 2018-02-08 DIAGNOSIS — I10 Essential (primary) hypertension: Secondary | ICD-10-CM | POA: Diagnosis not present

## 2018-02-08 DIAGNOSIS — M159 Polyosteoarthritis, unspecified: Secondary | ICD-10-CM | POA: Diagnosis not present

## 2018-07-26 ENCOUNTER — Other Ambulatory Visit: Payer: Self-pay

## 2018-07-26 ENCOUNTER — Other Ambulatory Visit (HOSPITAL_COMMUNITY)
Admission: RE | Admit: 2018-07-26 | Discharge: 2018-07-26 | Disposition: A | Discharge status: Home / Self Care | Payer: PPO | Source: Ambulatory Visit | Attending: Urology | Admitting: Urology

## 2018-07-26 ENCOUNTER — Ambulatory Visit (INDEPENDENT_AMBULATORY_CARE_PROVIDER_SITE_OTHER): Payer: PPO | Admitting: Urology

## 2018-07-26 DIAGNOSIS — N3020 Other chronic cystitis without hematuria: Secondary | ICD-10-CM

## 2018-07-26 DIAGNOSIS — N952 Postmenopausal atrophic vaginitis: Secondary | ICD-10-CM

## 2018-07-26 LAB — URINALYSIS, COMPLETE (UACMP) WITH MICROSCOPIC
Bilirubin Urine: NEGATIVE
Glucose, UA: NEGATIVE mg/dL
Hgb urine dipstick: NEGATIVE
Ketones, ur: NEGATIVE mg/dL
Leukocytes,Ua: NEGATIVE
Nitrite: NEGATIVE
Protein, ur: NEGATIVE mg/dL
Specific Gravity, Urine: 1.01 (ref 1.005–1.030)
pH: 5 (ref 5.0–8.0)

## 2018-07-27 LAB — URINE CULTURE: Culture: NO GROWTH

## 2018-08-06 DIAGNOSIS — M159 Polyosteoarthritis, unspecified: Secondary | ICD-10-CM | POA: Diagnosis not present

## 2018-08-06 DIAGNOSIS — N3281 Overactive bladder: Secondary | ICD-10-CM | POA: Diagnosis not present

## 2018-08-06 DIAGNOSIS — I10 Essential (primary) hypertension: Secondary | ICD-10-CM | POA: Diagnosis not present

## 2018-08-06 DIAGNOSIS — F419 Anxiety disorder, unspecified: Secondary | ICD-10-CM | POA: Diagnosis not present

## 2018-10-07 DIAGNOSIS — I10 Essential (primary) hypertension: Secondary | ICD-10-CM | POA: Diagnosis not present

## 2018-10-07 DIAGNOSIS — F419 Anxiety disorder, unspecified: Secondary | ICD-10-CM | POA: Diagnosis not present

## 2018-10-07 DIAGNOSIS — M159 Polyosteoarthritis, unspecified: Secondary | ICD-10-CM | POA: Diagnosis not present

## 2018-10-07 DIAGNOSIS — K219 Gastro-esophageal reflux disease without esophagitis: Secondary | ICD-10-CM | POA: Diagnosis not present

## 2018-10-07 DIAGNOSIS — Z23 Encounter for immunization: Secondary | ICD-10-CM | POA: Diagnosis not present

## 2018-11-10 IMAGING — US US RENAL
1 series · 14 of 25 positions shown · non-contrast
Comparison: None.

CLINICAL DATA: 89-year-old female with chronic cystitis and
hypertension. Initial encounter.

EXAM:
RENAL / URINARY TRACT ULTRASOUND COMPLETE

[Series 1: us renal · 0.23mm/px · 14 of 48 slices shown]
[im 1/48]
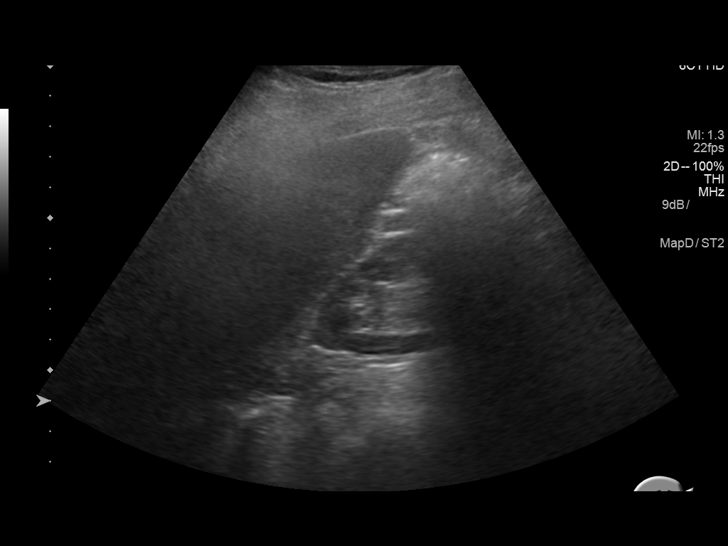
[im 4/48]
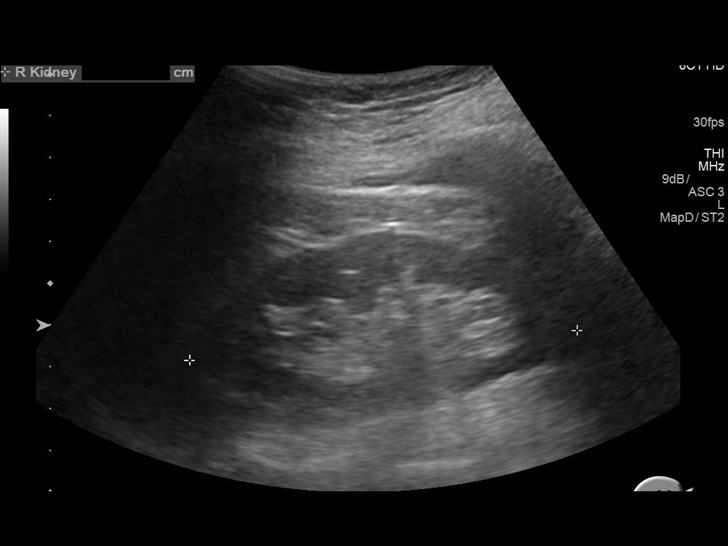
[im 8/48]
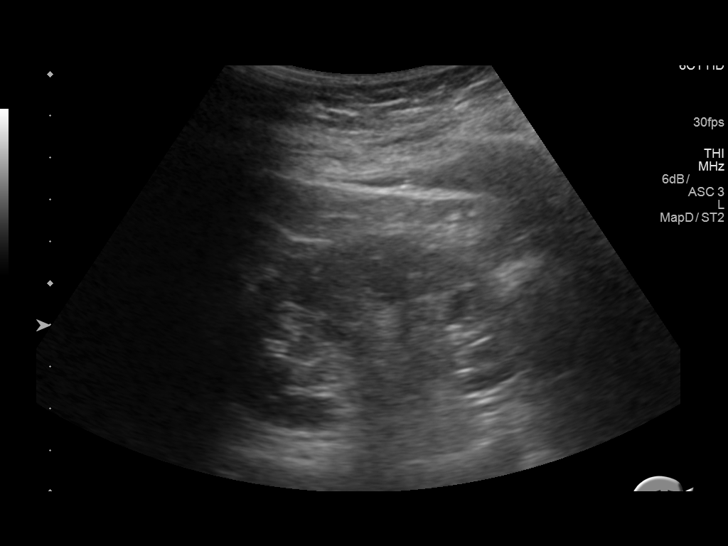
[im 12/48]
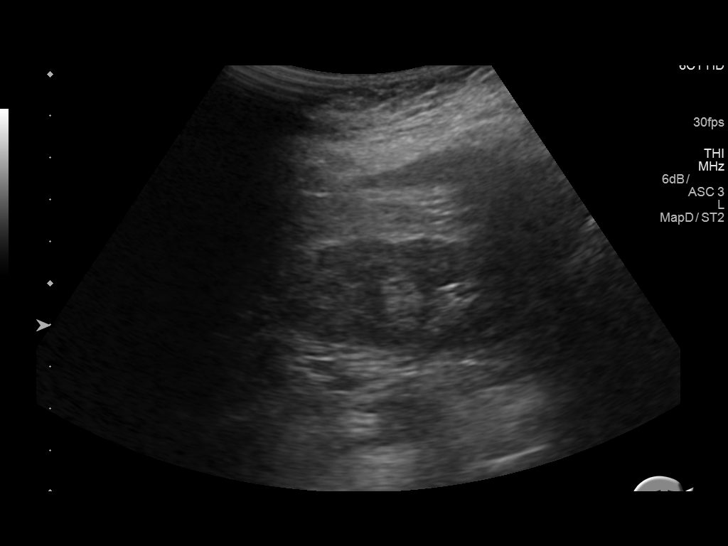
[im 16/48]
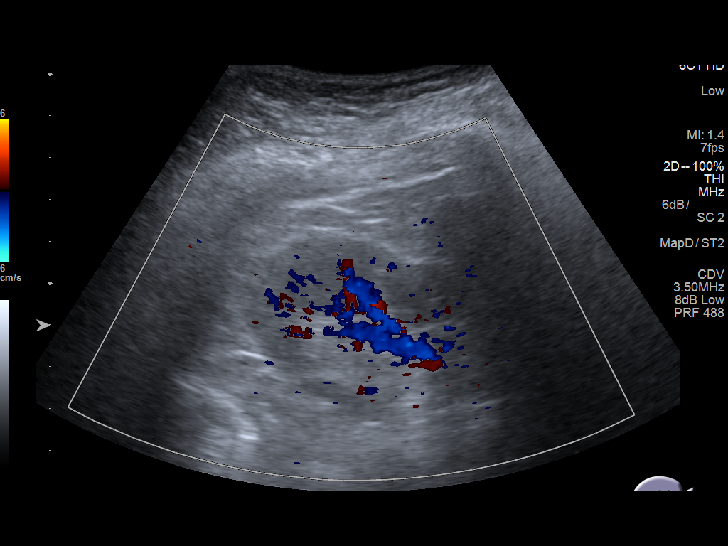
[im 18/48]
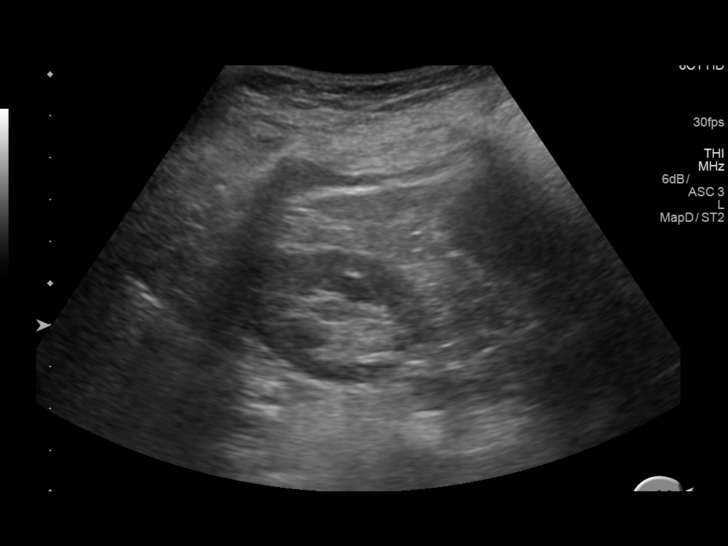
[im 22/48]
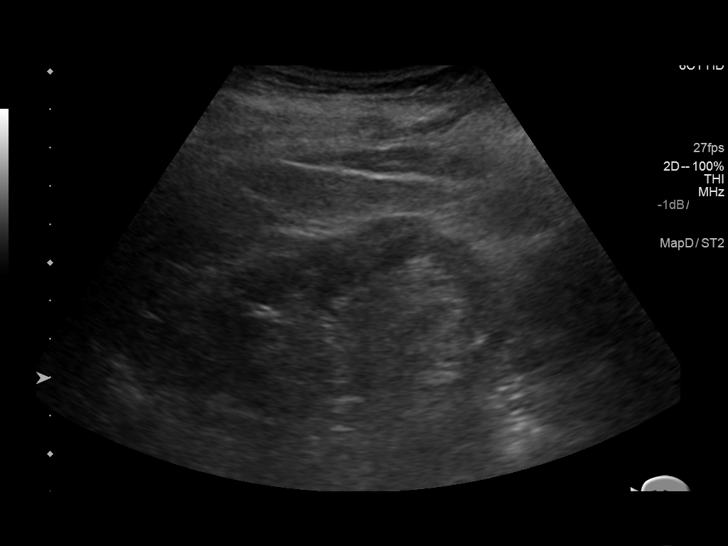
[im 26/48]
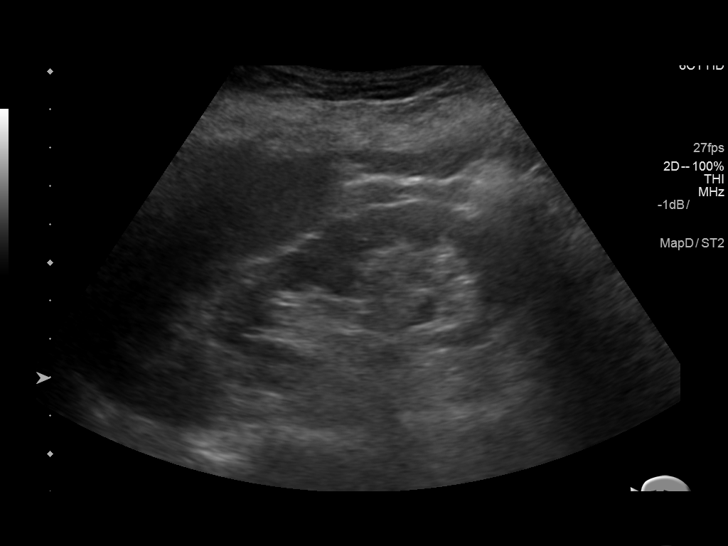
[im 30/48]
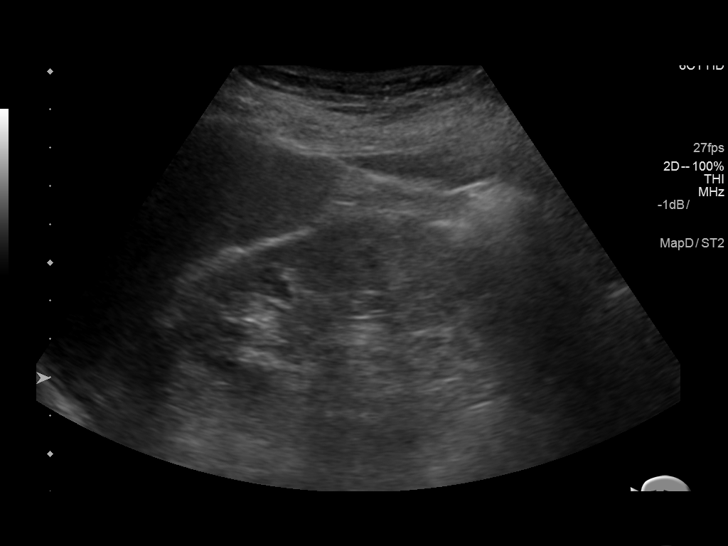
[im 32/48]
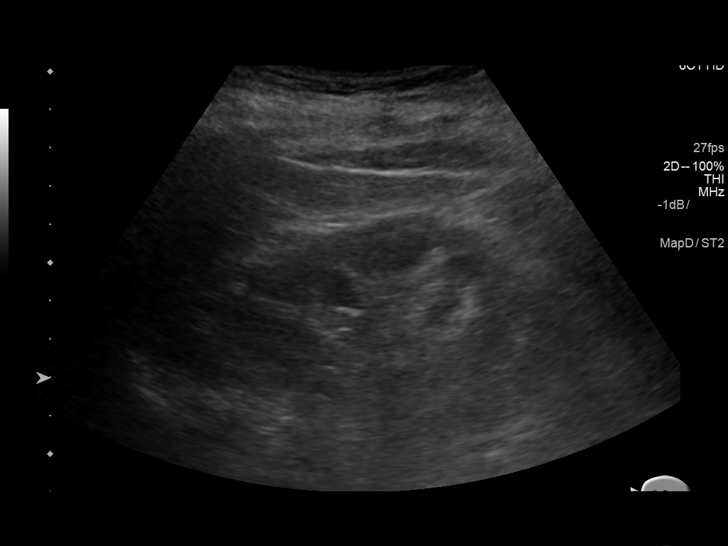
[im 36/48]
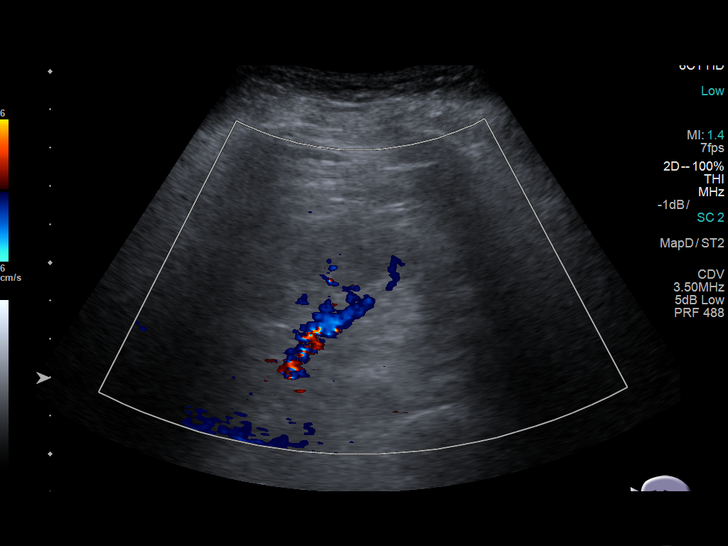
[im 40/48]
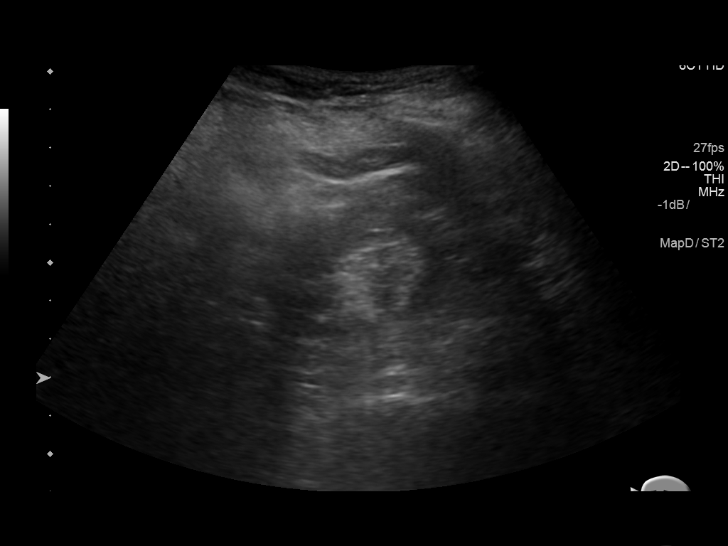
[im 44/48]
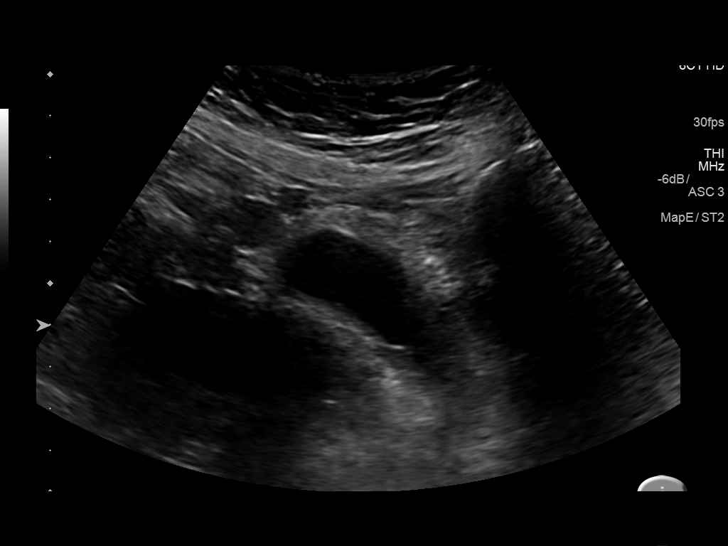
[im 48/48]
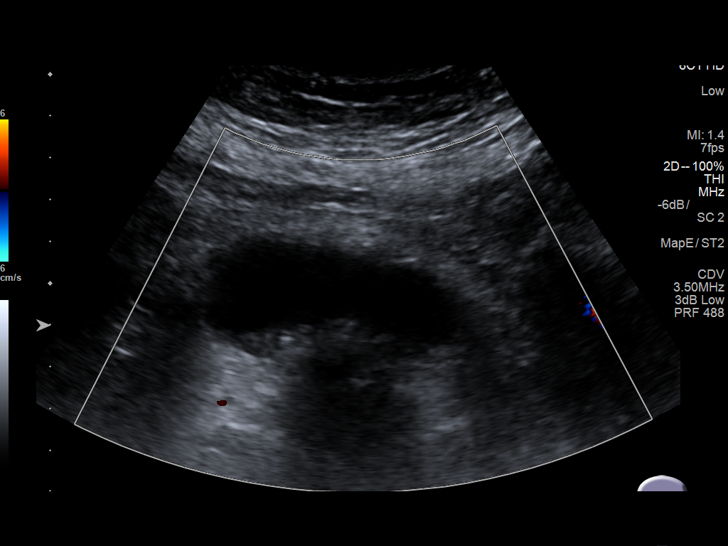

[14 of 25 positions shown; findings below may reference images not displayed]

FINDINGS: Right Kidney:

Length: 9.3 cm. Echogenicity within normal limits. No mass or
hydronephrosis visualized.

Left Kidney:

Length: 8.3 cm. Echogenicity within normal limits. No mass or
hydronephrosis visualized.

Bladder:

Partially contracted with circumferential wall thickening without
obvious mass. Ureteral jets not visualized.
IMPRESSION: No hydronephrosis.

Partially contracted with circumferential wall thickening without
obvious mass.

## 2019-01-07 ENCOUNTER — Encounter: Payer: Self-pay | Admitting: Urology

## 2019-01-14 DIAGNOSIS — H401132 Primary open-angle glaucoma, bilateral, moderate stage: Secondary | ICD-10-CM | POA: Diagnosis not present

## 2019-01-20 ENCOUNTER — Telehealth: Payer: Self-pay | Admitting: Urology

## 2019-01-20 NOTE — Telephone Encounter (Signed)
pts daughter called and would like to know if she needs a medication called in to her pharmacy due to burning and urgency. She has appointment in February for a f/u. She uses [facility] in [place], [place].

## 2019-01-21 NOTE — Telephone Encounter (Signed)
May we do a urine drop off for urinary symptoms?

## 2019-01-22 NOTE — Telephone Encounter (Signed)
Pt cannot drop off urine sample due to her staying with her daughter currently in [place]. I am also sending Dr. Annabell Howells a secure chat regarding this.

## 2019-01-22 NOTE — Telephone Encounter (Signed)
She can do a UA drop off.    In the future if you need me to act on something on short notice, the chat function gets my attention the quickest.   I am not monitoring Epic as much ans Urochart so I can miss messages that don't send a notification.

## 2019-01-23 ENCOUNTER — Telehealth: Payer: Self-pay

## 2019-01-23 ENCOUNTER — Telehealth: Payer: Self-pay | Admitting: Urology

## 2019-01-23 ENCOUNTER — Other Ambulatory Visit: Payer: Self-pay

## 2019-01-23 DIAGNOSIS — N390 Urinary tract infection, site not specified: Secondary | ICD-10-CM

## 2019-01-23 MED ORDER — NITROFURANTOIN MONOHYD MACRO 100 MG PO CAPS: 100.0000 mg | ORAL_CAPSULE | Freq: Two times a day (BID) | ORAL | 0 refills | Status: DC

## 2019-01-23 NOTE — Telephone Encounter (Signed)
pts daughter states she thought Marchelle Folks told her 3 times a day on the med we sent in today and the bottle says every 12 hours. She wanted to clarify.

## 2019-01-23 NOTE — Telephone Encounter (Signed)
Called and spoke with daughter. Verbal order was given by Dr. Annabell Howells to send in Macrobid 100mg  BID #[id number]  Sent to pharmacy that pt is located at right now .

## 2019-01-24 NOTE — Telephone Encounter (Signed)
  Daughter notified of prescription directions.

## 2019-01-27 NOTE — Telephone Encounter (Signed)
Please see prior documentation. Antibiotic sent in and family aware.

## 2019-03-06 NOTE — Progress Notes (Deleted)
Subjective:  1. Chronic cystitis   2. Atrophic vaginitis      Tracey Walsh is a 86 year-old female established patient who is here for the evaluation of an urinary tract infection.  The patient does get a number of urinary tract infections. When the patient has a UTI she reports having the following symptoms: frequency and incontinence. The patient's symptoms do respond favorably to antibiotics.   07/26/18: Tracey Walsh returns today in f/u for her history of recurrent UTI's. She is currently on topical estrogen but she doesn't think that helps that much. She is not on antibiotics now but got a short course of Cipro in April. She has done well without infection since her appt in October. She has some frequency and stable incontinence. She has nocturia. She has some dysuria that is transient. She has had no hematuria. Her UA today is ok.      ROS:  ROS:  A complete review of systems was performed.  All systems are negative except for pertinent findings as noted.   ROS  No Known Allergies  Outpatient Encounter Medications as of 03/07/2019  Medication Sig Note  . ALPRAZolam (XANAX) 0.5 MG tablet 1 tablet 3 (three) times daily as needed. 02/08/2016: Taking once at night and sometimes during the day if you need it  . amLODipine (NORVASC) 5 MG tablet 1 tablet daily. 10/19/2015: Received from: External Pharmacy  . CALCIUM PO Take 2 tablets by mouth daily. 02/08/2016: Currently out; requested delivery of OTC from [facility] on next delivery  . cephALEXin (KEFLEX) 500 MG capsule Take 1 capsule (500 mg total) by mouth 3 (three) times daily. (Patient not taking: Reported on 02/08/2016)   . clopidogrel (PLAVIX) 75 MG tablet Take 1 tablet (75 mg total) by mouth daily.   . COMBIGAN 0.2-0.5 % ophthalmic solution Apply 1 drop to eye 2 (two) times daily.    Marland Kitchen LUMIGAN 0.01 % SOLN Apply 1 drop to eye at bedtime.    . nitrofurantoin, macrocrystal-monohydrate, (MACROBID) 100 MG capsule Take 1  capsule (100 mg total) by mouth every 12 (twelve) hours.   . pantoprazole (PROTONIX) 40 MG tablet Take 40 mg by mouth 2 (two) times daily. Prescribed one tablet twice daily   . Probiotic Product (TRUBIOTICS PO) Take 1 tablet by mouth daily.    No facility-administered encounter medications on file as of 03/07/2019.    Past Medical History:  Diagnosis Date  . Glaucoma   . HTN (hypertension)   . Transient ischemic attack (TIA)     Past Surgical History:  Procedure Laterality Date  . None      Social History   Socioeconomic History  . Marital status: Widowed    Spouse name: Not on file  . Number of children: Not on file  . Years of education: Not on file  . Highest education level: Not on file  Occupational History  . Not on file  Tobacco Use  . Smoking status: Never Smoker  . Smokeless tobacco: Never Used  Substance and Sexual Activity  . Alcohol use: No  . Drug use: No  . Sexual activity: Not on file  Other Topics Concern  . Not on file  Social History Narrative  . Not on file   Social Determinants of Health   Financial Resource Strain:   . Difficulty of Paying Living Expenses: Not on file  Food Insecurity:   . Worried About Charity fundraiser in the Last Year: Not on file  . Ran  Out of Food in the Last Year: Not on file  Transportation Needs:   . Lack of Transportation (Medical): Not on file  . Lack of Transportation (Non-Medical): Not on file  Physical Activity:   . Days of Exercise per Week: Not on file  . Minutes of Exercise per Session: Not on file  Stress:   . Feeling of Stress : Not on file  Social Connections:   . Frequency of Communication with Friends and Family: Not on file  . Frequency of Social Gatherings with Friends and Family: Not on file  . Attends Religious Services: Not on file  . Active Member of Clubs or Organizations: Not on file  . Attends Banker Meetings: Not on file  . Marital Status: Not on file  Intimate Partner  Violence:   . Fear of Current or Ex-Partner: Not on file  . Emotionally Abused: Not on file  . Physically Abused: Not on file  . Sexually Abused: Not on file    Family History  Problem Relation Age of Onset  . Hypertension Father   . Diabetes Mother        Objective: There were no vitals filed for this visit.   Physical Exam  Lab Results:  No results found for this or any previous visit (from the past 24 hour(s)).  BMET No results for input(s): NA, K, CL, CO2, GLUCOSE, BUN, CREATININE, CALCIUM in the last 72 hours. PSA No results found for: PSA No results found for: TESTOSTERONE    Studies/Results: No results found.    Assessment & Plan: No problem-specific Assessment & Plan notes found for this encounter.    No orders of the defined types were placed in this encounter.    No orders of the defined types were placed in this encounter.     No follow-ups on file.   CC: [name], MD      Bjorn Pippin 03/06/2019

## 2019-03-07 ENCOUNTER — Ambulatory Visit: Payer: PPO | Admitting: Urology

## 2019-03-14 ENCOUNTER — Ambulatory Visit (INDEPENDENT_AMBULATORY_CARE_PROVIDER_SITE_OTHER): Payer: PPO | Admitting: Urology

## 2019-03-14 ENCOUNTER — Other Ambulatory Visit: Payer: Self-pay

## 2019-03-14 ENCOUNTER — Encounter: Payer: Self-pay | Admitting: Urology

## 2019-03-14 VITALS — BP 144/81 | HR 76 | Temp 98.1°F | Ht 60.0 in | Wt 148.0 lb

## 2019-03-14 DIAGNOSIS — Z87440 Personal history of urinary (tract) infections: Secondary | ICD-10-CM | POA: Diagnosis not present

## 2019-03-14 DIAGNOSIS — N393 Stress incontinence (female) (male): Secondary | ICD-10-CM | POA: Diagnosis not present

## 2019-03-14 DIAGNOSIS — N952 Postmenopausal atrophic vaginitis: Secondary | ICD-10-CM

## 2019-03-14 LAB — POCT URINALYSIS DIPSTICK
Bilirubin, UA: NEGATIVE
Blood, UA: NEGATIVE
Glucose, UA: NEGATIVE
Ketones, UA: NEGATIVE
Leukocytes, UA: NEGATIVE
Nitrite, UA: NEGATIVE
Protein, UA: NEGATIVE
Spec Grav, UA: 1.01 (ref 1.010–1.025)
Urobilinogen, UA: NEGATIVE E.U./dL — AB
pH, UA: 5 (ref 5.0–8.0)

## 2019-03-14 MED ORDER — ESTRADIOL 0.1 MG/GM VA CREA: TOPICAL_CREAM | VAGINAL | 3 refills | Status: AC

## 2019-03-14 NOTE — Progress Notes (Signed)
Subjective:  1. Urinary tract infection without hematuria, site unspecified   2. Vaginal atrophy   3. Stress incontinence, female      Tracey Walsh is a 86 year-old female established patient who is here for the evaluation of her history of urinary tract infections  03/14/19:  She had a symptomatic episode about a month ago and was given nitrofurantoin in January.   She is doing well today and her UA is clear.   She has some vaginal itching at times and doesn't think the topical estrogen helps with that too much.  She has mild incontinence and wears a pad.  She has had no hematuria.  ROS:  ROS:  A complete review of systems was performed.  All systems are negative except for pertinent findings as noted.   Review of Systems  Genitourinary: Positive for frequency.  All other systems reviewed and are negative.   No Known Allergies  Outpatient Encounter Medications as of 03/14/2019  Medication Sig Note  . ALPRAZolam (XANAX) 0.5 MG tablet 1 tablet 3 (three) times daily as needed. 02/08/2016: Taking once at night and sometimes during the day if you need it  . amLODipine (NORVASC) 5 MG tablet 1 tablet daily. 10/19/2015: Received from: External Pharmacy  . CALCIUM PO Take 2 tablets by mouth daily. 02/08/2016: Currently out; requested delivery of OTC from [facility] on next delivery  . cephALEXin (KEFLEX) 500 MG capsule Take 1 capsule (500 mg total) by mouth 3 (three) times daily.   . clopidogrel (PLAVIX) 75 MG tablet Take 1 tablet (75 mg total) by mouth daily.   . COMBIGAN 0.2-0.5 % ophthalmic solution Apply 1 drop to eye 2 (two) times daily.    Marland Kitchen LUMIGAN 0.01 % SOLN Apply 1 drop to eye at bedtime.    . nitrofurantoin, macrocrystal-monohydrate, (MACROBID) 100 MG capsule Take 1 capsule (100 mg total) by mouth every 12 (twelve) hours.   . pantoprazole (PROTONIX) 40 MG tablet Take 40 mg by mouth 2 (two) times daily. Prescribed one tablet twice daily   . Probiotic Product (TRUBIOTICS PO)  Take 1 tablet by mouth daily.   . traZODone (DESYREL) 50 MG tablet Take 50 mg by mouth at bedtime.   Marland Kitchen estradiol (ESTRACE) 0.1 MG/GM vaginal cream Apply 0.5gm or a pea sized amount vaginally at bedtime 2-3x weekly.    No facility-administered encounter medications on file as of 03/14/2019.    Past Medical History:  Diagnosis Date  . Glaucoma   . HTN (hypertension)   . Transient ischemic attack (TIA)     Past Surgical History:  Procedure Laterality Date  . None      Social History   Socioeconomic History  . Marital status: Widowed    Spouse name: Not on file  . Number of children: Not on file  . Years of education: Not on file  . Highest education level: Not on file  Occupational History  . Not on file  Tobacco Use  . Smoking status: Never Smoker  . Smokeless tobacco: Never Used  Substance and Sexual Activity  . Alcohol use: No  . Drug use: No  . Sexual activity: Not on file  Other Topics Concern  . Not on file  Social History Narrative  . Not on file   Social Determinants of Health   Financial Resource Strain:   . Difficulty of Paying Living Expenses: Not on file  Food Insecurity:   . Worried About Programme researcher, broadcasting/film/video in the Last Year: Not on  file  . [place] in the Last Year: Not on file  Transportation Needs:   . Lack of Transportation (Medical): Not on file  . Lack of Transportation (Non-Medical): Not on file  Physical Activity:   . Days of Exercise per Week: Not on file  . Minutes of Exercise per Session: Not on file  Stress:   . Feeling of Stress : Not on file  Social Connections:   . Frequency of Communication with Friends and Family: Not on file  . Frequency of Social Gatherings with Friends and Family: Not on file  . Attends Religious Services: Not on file  . Active Member of Clubs or Organizations: Not on file  . Attends Archivist Meetings: Not on file  . Marital Status: Not on file  Intimate Partner Violence:   . Fear of  Current or Ex-Partner: Not on file  . Emotionally Abused: Not on file  . Physically Abused: Not on file  . Sexually Abused: Not on file    Family History  Problem Relation Age of Onset  . Hypertension Father   . Diabetes Mother        Objective: Vitals:   03/14/19 1034  BP: (!) 144/81  Pulse: 76  Temp: 98.1 F (36.7 C)     Physical Exam  Lab Results:  Results for orders placed or performed in visit on 03/14/19 (from the past 24 hour(s))  POCT urinalysis dipstick     Status: Abnormal   Collection Time: 03/14/19 10:34 AM  Result Value Ref Range   Color, UA yellow    Clarity, UA clear    Glucose, UA Negative Negative   Bilirubin, UA neg    Ketones, UA neg    Spec Grav, UA 1.010 1.010 - 1.025   Blood, UA neg    pH, UA 5.0 5.0 - 8.0   Protein, UA Negative Negative   Urobilinogen, UA negative (A) 0.2 or 1.0 E.U./dL   Nitrite, UA neg    Leukocytes, UA Negative Negative   Appearance clear    Odor      BMET No results for input(s): NA, K, CL, CO2, GLUCOSE, BUN, CREATININE, CALCIUM in the last 72 hours. PSA No results found for: PSA No results found for: TESTOSTERONE    Studies/Results: No results found.    Assessment & Plan: Hx of UTI's.    She had a breakthrough in January but is doing well now.     Atrophic vaginitis.  Estrace refilled.  SUI.  She  Is doing well with mild incontinence.   Meds ordered this encounter  Medications  . estradiol (ESTRACE) 0.1 MG/GM vaginal cream    Sig: Apply 0.5gm or a pea sized amount vaginally at bedtime 2-3x weekly.    Dispense:  42.5 g    Refill:  3     Orders Placed This Encounter  Procedures  . POCT urinalysis dipstick      Return in about 6 months (around 09/11/2019) for For history of UTI's. .   CC: Sinda Du, MD      Irine Seal 03/14/2019

## 2019-03-27 DIAGNOSIS — H25813 Combined forms of age-related cataract, bilateral: Secondary | ICD-10-CM | POA: Diagnosis not present

## 2019-03-27 DIAGNOSIS — H02834 Dermatochalasis of left upper eyelid: Secondary | ICD-10-CM | POA: Diagnosis not present

## 2019-03-27 DIAGNOSIS — H02831 Dermatochalasis of right upper eyelid: Secondary | ICD-10-CM | POA: Diagnosis not present

## 2019-03-27 DIAGNOSIS — H401431 Capsular glaucoma with pseudoexfoliation of lens, bilateral, mild stage: Secondary | ICD-10-CM | POA: Diagnosis not present

## 2019-05-14 NOTE — H&P (Signed)
Surgical History & Physical  Patient Name: Tracey Walsh DOB: [birth year]  Surgery: Cataract extraction with intraocular lens implant phacoemulsification and insertion of aqueous drainage device/iStent; Right Eye  Surgeon: [name] MD Surgery Date:  05/23/2019 Pre-Op Date:  05/14/2019  HPI: A 86 Yr. old female patient -referred by Dr Charise Killian for cataract eval. (also F/U for glaucoma) Using Combigan BID OU, Lumigan BID OU. 1. 1. The patient complains of difficulty when viewing TV, reading closed caption, news scrolls on TV, which began 5 months ago. Both eyes are affected. The episode is gradual. The condition's severity increased since last visit. Symptoms occur when the patient is inside, outside and reading. This is negatively affecting the patient's quality of life. HPI was performed by [name] .  Medical History: Glaucoma Pseudoexfoliation OU High Blood Pressure TIA, Acid reflux  Review of Systems Negative Allergic/Immunologic Negative Cardiovascular Negative Constitutional Negative Ear, Nose, Mouth & Throat Negative Endocrine Negative Eyes Negative Gastrointestinal Negative Genitourinary Negative Hemotologic/Lymphatic Negative Integumentary Negative Musculoskeletal Negative Neurological Negative Psychiatry Negative Respiratory  Social   Never smoked   Medication Combigan, Lumigan,  Pantoprazole, Clopidogrel, Amlodipine Besylate, Alprazolam,   Sx/Procedures SLT,  Tonsilectomy,   Drug Allergies  Penicilin, Cinnabac, Macrodantin, BACTRIM, Tetracycline,   History & Physical: Heent:  Cataract, Glaucoma, Right eye NECK: supple without bruits LUNGS: lungs clear to auscultation CV: regular rate and rhythm Abdomen: soft and non-tender Impression & Plan: Assessment: 1.  COMBINED FORMS AGE RELATED CATARACT; Both Eyes (H25.813) 2.  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  DERMATOCHALASIS, no surgery; Right Upper Lid, Left Upper Lid  (H02.831, Z61.096)  Plan: 1.  Cataract accounts for the patient's decreased vision. This visual impairment is not correctable with a tolerable change in glasses or contact lenses. Cataract surgery with an implantation of a new lens should significantly improve the visual and functional status of the patient. Discussed all risks, benefits, alternatives, and potential complications. Discussed the procedures and recovery. Patient desires to have surgery. A-scan ordered and performed today for intra-ocular lens calculations. The surgery will be performed in order to improve vision for driving, reading, and for eye examinations. Recommend phacoemulsification with intra-ocular lens. Right Eye worse - first. Dilates poorly - shugacaine by protocol. Malyugin Ring - PXE. Omidira. Recommend iSent inject to control pressure in setting of glaucoma. 2.  Stable. Good IOP. OCT rNFL shows thinning OS today. On Combigan and Lumigan. Patient does report having difficulty remembering drop schedule AND has trouble squeezing the drop bottles. Based on compliance recommend iStent inject both eyes at the time of cataract surgery to improve compliance and help lower eye pressure. 3.  Will address after phaco PCIOL OU.

## 2019-05-19 DIAGNOSIS — H25811 Combined forms of age-related cataract, right eye: Secondary | ICD-10-CM | POA: Diagnosis not present

## 2019-05-20 ENCOUNTER — Encounter (HOSPITAL_COMMUNITY)
Admission: RE | Admit: 2019-05-20 | Discharge: 2019-05-20 | Disposition: A | Discharge status: Home / Self Care | Payer: PPO | Source: Ambulatory Visit | Attending: Ophthalmology | Admitting: Ophthalmology

## 2019-05-20 ENCOUNTER — Other Ambulatory Visit: Payer: Self-pay

## 2019-05-20 ENCOUNTER — Other Ambulatory Visit (HOSPITAL_COMMUNITY)
Admission: RE | Admit: 2019-05-20 | Discharge: 2019-05-20 | Disposition: A | Discharge status: Home / Self Care | Payer: PPO | Source: Ambulatory Visit | Attending: Ophthalmology | Admitting: Ophthalmology

## 2019-05-20 ENCOUNTER — Encounter (HOSPITAL_COMMUNITY): Payer: Self-pay

## 2019-05-20 DIAGNOSIS — Z01812 Encounter for preprocedural laboratory examination: Secondary | ICD-10-CM | POA: Diagnosis not present

## 2019-05-20 DIAGNOSIS — Z20822 Contact with and (suspected) exposure to covid-19: Secondary | ICD-10-CM | POA: Insufficient documentation

## 2019-05-21 LAB — SARS CORONAVIRUS 2 (TAT 6-24 HRS): SARS Coronavirus 2: NEGATIVE

## 2019-05-23 ENCOUNTER — Ambulatory Visit (HOSPITAL_COMMUNITY)
Admission: RE | Admit: 2019-05-23 | Discharge: 2019-05-23 | Disposition: A | Discharge status: Home / Self Care | Payer: PPO | Attending: Ophthalmology | Admitting: Ophthalmology

## 2019-05-23 ENCOUNTER — Ambulatory Visit (HOSPITAL_COMMUNITY): Payer: PPO | Admitting: Anesthesiology

## 2019-05-23 ENCOUNTER — Other Ambulatory Visit: Payer: Self-pay

## 2019-05-23 ENCOUNTER — Encounter (HOSPITAL_COMMUNITY): Payer: Self-pay | Admitting: Ophthalmology

## 2019-05-23 ENCOUNTER — Encounter (HOSPITAL_COMMUNITY)
Admission: RE | Disposition: A | Discharge status: Home / Self Care | Payer: Self-pay | Source: Home / Self Care | Attending: Ophthalmology

## 2019-05-23 DIAGNOSIS — H25813 Combined forms of age-related cataract, bilateral: Secondary | ICD-10-CM | POA: Diagnosis not present

## 2019-05-23 DIAGNOSIS — H401431 Capsular glaucoma with pseudoexfoliation of lens, bilateral, mild stage: Secondary | ICD-10-CM | POA: Diagnosis not present

## 2019-05-23 DIAGNOSIS — I10 Essential (primary) hypertension: Secondary | ICD-10-CM | POA: Diagnosis not present

## 2019-05-23 DIAGNOSIS — Z8673 Personal history of transient ischemic attack (TIA), and cerebral infarction without residual deficits: Secondary | ICD-10-CM | POA: Insufficient documentation

## 2019-05-23 DIAGNOSIS — H2181 Floppy iris syndrome: Secondary | ICD-10-CM | POA: Insufficient documentation

## 2019-05-23 DIAGNOSIS — K219 Gastro-esophageal reflux disease without esophagitis: Secondary | ICD-10-CM | POA: Insufficient documentation

## 2019-05-23 DIAGNOSIS — H401411 Capsular glaucoma with pseudoexfoliation of lens, right eye, mild stage: Secondary | ICD-10-CM | POA: Diagnosis not present

## 2019-05-23 DIAGNOSIS — H2511 Age-related nuclear cataract, right eye: Secondary | ICD-10-CM | POA: Diagnosis not present

## 2019-05-23 DIAGNOSIS — H25811 Combined forms of age-related cataract, right eye: Secondary | ICD-10-CM | POA: Diagnosis present

## 2019-05-23 DIAGNOSIS — Z79899 Other long term (current) drug therapy: Secondary | ICD-10-CM | POA: Insufficient documentation

## 2019-05-23 HISTORY — PX: INSERTION OF ANTERIOR SEGMENT AQUEOUS DRAINAGE DEVICE (ISTENT): SHX6783

## 2019-05-23 HISTORY — PX: CATARACT EXTRACTION W/PHACO: SHX586

## 2019-05-23 SURGERY — PHACOEMULSIFICATION, CATARACT, WITH IOL INSERTION
Anesthesia: Monitor Anesthesia Care | Site: Eye | Laterality: Right

## 2019-05-23 MED ORDER — POVIDONE-IODINE 5 % OP SOLN
OPHTHALMIC | Status: DC | PRN
  Administered 2019-05-23: 1 via OPHTHALMIC

## 2019-05-23 MED ORDER — BSS IO SOLN
INTRAOCULAR | Status: DC | PRN
  Administered 2019-05-23: 15 mL via INTRAOCULAR

## 2019-05-23 MED ORDER — NEOMYCIN-POLYMYXIN-DEXAMETH 3.5-10000-0.1 OP SUSP
OPHTHALMIC | Status: DC | PRN
  Administered 2019-05-23: 1 [drp] via OPHTHALMIC

## 2019-05-23 MED ORDER — PROVISC 10 MG/ML IO SOLN
INTRAOCULAR | Status: DC | PRN
  Administered 2019-05-23: 0.85 mL via INTRAOCULAR

## 2019-05-23 MED ORDER — PHENYLEPHRINE-KETOROLAC 1-0.3 % IO SOLN
INTRAOCULAR | Status: DC | PRN
  Administered 2019-05-23: 500 mL via OPHTHALMIC

## 2019-05-23 MED ORDER — LIDOCAINE HCL 3.5 % OP GEL
1.0000 "application " | Freq: Once | OPHTHALMIC | Status: AC
  Administered 2019-05-23: 1 via OPHTHALMIC

## 2019-05-23 MED ORDER — PHENYLEPHRINE-KETOROLAC 1-0.3 % IO SOLN
INTRAOCULAR | Status: AC
  Filled 2019-05-23: qty 4

## 2019-05-23 MED ORDER — PHENYLEPHRINE HCL 2.5 % OP SOLN
1.0000 [drp] | OPHTHALMIC | Status: AC | PRN
  Administered 2019-05-23 (×3): 1 [drp] via OPHTHALMIC

## 2019-05-23 MED ORDER — SODIUM HYALURONATE 23 MG/ML IO SOLN
INTRAOCULAR | Status: DC | PRN
  Administered 2019-05-23: 0.6 mL via INTRAOCULAR

## 2019-05-23 MED ORDER — LIDOCAINE HCL (PF) 1 % IJ SOLN
INTRAOCULAR | Status: DC | PRN
  Administered 2019-05-23: 12:00:00 1 mL via OPHTHALMIC

## 2019-05-23 MED ORDER — TETRACAINE HCL 0.5 % OP SOLN
1.0000 [drp] | OPHTHALMIC | Status: AC | PRN
  Administered 2019-05-23 (×3): 1 [drp] via OPHTHALMIC

## 2019-05-23 MED ORDER — CYCLOPENTOLATE-PHENYLEPHRINE 0.2-1 % OP SOLN
1.0000 [drp] | OPHTHALMIC | Status: AC | PRN
  Administered 2019-05-23 (×3): 1 [drp] via OPHTHALMIC

## 2019-05-23 SURGICAL SUPPLY — 16 items
CLOTH BEACON ORANGE TIMEOUT ST (SAFETY) ×2 IMPLANT
DEVICE INJECT ISTENT W (Stent) IMPLANT
EYE SHIELD UNIVERSAL CLEAR (GAUZE/BANDAGES/DRESSINGS) ×2 IMPLANT
GLOVE BIOGEL PI IND STRL 7.0 (GLOVE) IMPLANT
GLOVE BIOGEL PI INDICATOR 7.0 (GLOVE) ×4
INJECT ISTENT W (Stent) ×3 IMPLANT
LENS ALC ACRYL/TECN (Ophthalmic Related) ×2 IMPLANT
NDL HYPO 18GX1.5 BLUNT FILL (NEEDLE) IMPLANT
NEEDLE HYPO 18GX1.5 BLUNT FILL (NEEDLE) ×3 IMPLANT
PAD ARMBOARD 7.5X6 YLW CONV (MISCELLANEOUS) ×2 IMPLANT
RING MALYGIN 7.0 (MISCELLANEOUS) ×2 IMPLANT
SYR TB 1ML LL NO SAFETY (SYRINGE) ×2 IMPLANT
TAPE SURG TRANSPORE 1 IN (GAUZE/BANDAGES/DRESSINGS) IMPLANT
TAPE SURGICAL TRANSPORE 1 IN (GAUZE/BANDAGES/DRESSINGS) ×3
VISCOELASTIC ADDITIONAL (OPHTHALMIC RELATED) ×2 IMPLANT
WATER STERILE IRR 250ML POUR (IV SOLUTION) ×2 IMPLANT

## 2019-05-23 NOTE — Addendum Note (Signed)
Addendum  created 05/23/19 1237 by Orlie Pollen, CRNA   Charge Capture section accepted

## 2019-05-23 NOTE — Op Note (Signed)
Date of procedure: 05/23/19  Pre-operative diagnosis:  1. Visually significant combined form of age-related cataract, Right Eye; Poor Dilation, Right Eye (H25.811; H21.81) 2. Pseudoexfoliation glaucoma, mild stage, right eye  Post-operative diagnosis:  1. Visually significant combined form of age-related cataract, Right Eye; Poor Dilation, Right Eye (H25.811; H21.81) 2. Pseudoexfoliation glaucoma, mild stage, right eye  Procedure:  1.Removal of cataract via phacoemulsification and insertion of intra-ocular lens [place]  +24.5D into the capsular bag of the Right Eye (CPT [phone]) 2. Placement of iStent Inject W into the trabecular meshwork of the right eye  Attending surgeon: Gerda Diss. Lisel Siegrist, MD, MA  Anesthesia: MAC, Topical [place]  Complications: None  Estimated Blood Loss: <63m (minimal)  Specimens: None  Implants: As above  Indications:  1. Visually significant combined form of age-related cataract, Right Eye; Poor Dilation, Right Eye (H25.811; H21.81) 2. Pseudoexfoliation glaucoma, mild stage, right eye  Procedure:  The patient was seen and identified in the pre-operative area. The operative eye was identified and dilated.  The operative eye was marked.  Topical anesthesia was administered to the operative eye.     The patient was then to the operative suite and placed in the supine position.  A timeout was performed confirming the patient, procedure to be performed, and all other relevant information.   The patient's face was prepped and draped in the usual fashion for intra-ocular surgery.  A lid speculum was placed into the operative eye and the surgical microscope moved into place and focused.  Poor dilation of the iris was confirmed.  A superotemporal paracentesis was created using a 20 gauge paracentesis blade.  Shugarcaine was injected into the anterior chamber.  Viscoelastic was injected into the anterior chamber.  A temporal clear-corneal main wound  incision was created using a 2.450mmicrokeratome.  A Malyugin ring was placed.  A continuous curvilinear capsulorrhexis was initiated using an irrigating cystitome and completed using capsulorrhexis forceps.  Hydrodissection and hydrodeliniation were performed.  Viscoelastic was injected into the anterior chamber.  A phacoemulsification handpiece and a chopper as a second instrument were used to remove the nucleus and epinucleus. The irrigation/aspiration handpiece was used to remove any remaining cortical material.   The capsular bag was reinflated with viscoelastic, checked, and found to be intact.  The intraocular lens was inserted into the capsular bag and dialed into place using a Kuglen hook.  The patient's head was repositioned.  iStent inject W (2 stents) were placed 2 clock hours apart under gonioscopy in the nasal trabecular meshwork without complication.   The head was repositioned again.  The Malyugin ring was removed.  The irrigation/aspiration handpiece was used to remove any remaining viscoelastic.  The clear corneal wound and paracentesis wounds were then hydrated and checked with Weck-Cels to be watertight.  The lid-speculum and drape was removed, and the patient's face was cleaned with a wet and dry 4x4.  Maxitrol was instilled in the eye before a clear shield was taped over the eye. The patient was taken to the post-operative care unit in good condition, having tolerated the procedure well.  Post-Op Instructions: [facility] and will receive all other orders and instructions.

## 2019-05-23 NOTE — Anesthesia Postprocedure Evaluation (Signed)
Anesthesia Post Note  Patient: Tracey Walsh  Procedure(s) Performed: CATARACT EXTRACTION PHACO AND INTRAOCULAR LENS PLACEMENT (IOC) (Right Eye) INSERTION OF ANTERIOR SEGMENT AQUEOUS DRAINAGE DEVICE (ISTENT) (Right Eye)  Patient location during evaluation: Phase II Anesthesia Type: MAC Level of consciousness: awake and alert Pain management: pain level controlled Vital Signs Assessment: post-procedure vital signs reviewed and stable Respiratory status: spontaneous breathing, nonlabored ventilation, respiratory function stable and patient connected to nasal cannula oxygen Cardiovascular status: stable and blood pressure returned to baseline Postop Assessment: no apparent nausea or vomiting Anesthetic complications: no     Last Vitals:  Vitals:   05/23/19 1111  BP: (!) 170/97  Pulse: 87  Temp: 36.6 C    Last Pain:  Vitals:   05/23/19 1111  TempSrc: Oral  PainSc: 0-No pain                 Dejan Angert

## 2019-05-23 NOTE — Discharge Instructions (Signed)
Please discharge patient when stable, will follow up today with Dr. Ariyana Faw at the [facility] [place] office immediately following discharge.  Leave shield in place until visit.  All paperwork with discharge instructions will be given at the office.  [facility] Address:  [address]  [place], [place] [address]  

## 2019-05-23 NOTE — Transfer of Care (Signed)
Immediate Anesthesia Transfer of Care Note  Patient: Tracey Walsh  Procedure(s) Performed: CATARACT EXTRACTION PHACO AND INTRAOCULAR LENS PLACEMENT (IOC) (Right Eye) INSERTION OF ANTERIOR SEGMENT AQUEOUS DRAINAGE DEVICE (ISTENT) (Right Eye)  Patient Location: PACU  Anesthesia Type:MAC  Level of Consciousness: awake, alert , oriented and patient cooperative  Airway & Oxygen Therapy: Patient Spontanous Breathing  Post-op Assessment: Report given to RN, Post -op Vital signs reviewed and stable and Patient moving all extremities X 4  Post vital signs: Reviewed and stable  Last Vitals:  Vitals Value Taken Time  BP    Temp    Pulse    Resp    SpO2      Last Pain:  Vitals:   05/23/19 1111  TempSrc: Oral  PainSc: 0-No pain         Complications: No apparent anesthesia complications

## 2019-05-23 NOTE — Interval H&P Note (Signed)
History and Physical Interval Note: The H and P was reviewed and updated. The patient was examined.  No changes were found after exam.  The surgical eye was marked.  05/23/2019 11:50 AM  Tracey Walsh  has presented today for surgery, with the diagnosis of Nuclear sclerotic cataract - Right eye Glaucoma - Right eye.  The various methods of treatment have been discussed with the patient and family. After consideration of risks, benefits and other options for treatment, the patient has consented to  Procedure(s) with comments: CATARACT EXTRACTION PHACO AND INTRAOCULAR LENS PLACEMENT (IOC) (Right) - CDE:  INSERTION OF ANTERIOR SEGMENT AQUEOUS DRAINAGE DEVICE (ISTENT) (Right) as a surgical intervention.  The patient's history has been reviewed, patient examined, no change in status, stable for surgery.  I have reviewed the patient's chart and labs.  Questions were answered to the patient's satisfaction.     [name]

## 2019-05-23 NOTE — Anesthesia Preprocedure Evaluation (Signed)
Anesthesia Evaluation  Patient identified by MRN, date of birth, ID band Patient awake    Reviewed: Allergy & Precautions, H&P , NPO status , Patient's Chart, lab work & pertinent test results, reviewed documented beta blocker date and time   Airway Mallampati: II  TM Distance: >3 FB Neck ROM: full    Dental no notable dental hx. (+) Teeth Intact   Pulmonary    Pulmonary exam normal breath sounds clear to auscultation       Cardiovascular Exercise Tolerance: Good hypertension, negative cardio ROS   Rhythm:regular Rate:Normal     Neuro/Psych TIAnegative psych ROS   GI/Hepatic negative GI ROS, Neg liver ROS,   Endo/Other  negative endocrine ROS  Renal/GU negative Renal ROS  negative genitourinary   Musculoskeletal   Abdominal   Peds  Hematology negative hematology ROS (+)   Anesthesia Other Findings   Reproductive/Obstetrics negative OB ROS                             Anesthesia Physical Anesthesia Plan  ASA: II  Anesthesia Plan: MAC   Post-op Pain Management:    Induction:   PONV Risk Score and Plan: 3  Airway Management Planned:   Additional Equipment:   Intra-op Plan:   Post-operative Plan:   Informed Consent: I have reviewed the patients History and Physical, chart, labs and discussed the procedure including the risks, benefits and alternatives for the proposed anesthesia with the patient or authorized representative who has indicated his/her understanding and acceptance.     Dental Advisory Given  Plan Discussed with: CRNA  Anesthesia Plan Comments:         Anesthesia Quick Evaluation

## 2019-05-23 NOTE — Addendum Note (Signed)
Addendum  created 05/23/19 1347 by Franco Nones, CRNA   Charge Capture section accepted

## 2019-05-30 NOTE — H&P (Signed)
Surgical History & Physical  Patient Name: Tracey Walsh DOB: 1924/04/30  Surgery: Cataract extraction with intraocular lens implant phacoemulsification and insertion of aqueous drainage device, iStent inject; Left Eye  Surgeon: [name] MD Surgery Date:  06/06/2019 Pre-Op Date:  05/29/2019  HPI: A 65 Yr. old female patient 1. The patient is returning after cataract surgery. The right eye is affected. Status post cataract surgery, which began 1 week ago: Since the last visit, the affected area feels improvement and is doing well. Patient is following medication instructions. Patient now complains of blurry vision OS at all distances. She can tell a difference in clarity and brightness between each eye. This is This is negatively affecting the patient's quality of life, so she would like to proceed with cataract surgery OS. HPI Completed by Dr. [name]  Medical History: Glaucoma PC IOL w/ iStent OD (05/23/19, [facility]) Pseudoexfoliation OU High Blood Pressure TIA, Acid reflux  Review of Systems Negative Allergic/Immunologic Negative Cardiovascular Negative Constitutional Negative Ear, Nose, Mouth & Throat Negative Endocrine Negative Eyes Negative Gastrointestinal Negative Genitourinary Negative Hemotologic/Lymphatic Negative Integumentary Negative Musculoskeletal Negative Neurological Negative Psychiatry Negative Respiratory  Social   Never smoked  Medication Lumigan, Prednisolone-gatiflox-bromfenac,  Pantoprazole, Clopidogrel, Amlodipine Besylate, Alprazolam,   Sx/Procedures SLT, Phaco c IOL OD with iStent Inject W,  Tonsilectomy,   Drug Allergies  Penicilin, Cinnabac, Macrodantin, BACTRIM, Tetracycline,   History & Physical: Heent:  Cataract, Glaucoma, Left eye NECK: supple without bruits LUNGS: lungs clear to auscultation CV: regular rate and rhythm Abdomen: soft and non-tender  Impression & Plan: Assessment: 1.  CATARACT EXTRACTION STATUS; Right  Eye (Z98.41) 2.  COMBINED FORMS AGE RELATED CATARACT; Left Eye (H25.812) 3.  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  1 week after cataract surgery. Doing well with improved vision and normal eye pressure. Call with any problems or concerns. Continue Gati-Brom-Pred 2x/day for 3 more weeks. 2.  Dilates poorly - shugacaine by protocol. Malyugin Ring - PXE. Omidira. Recommend iSent inject to control pressure in setting of glaucoma. Cataract accounts for the patient's decreased vision. This visual impairment is not correctable with a tolerable change in glasses or contact lenses. Cataract surgery with an implantation of a new lens should significantly improve the visual and functional status of the patient. Discussed all risks, benefits, alternatives, and potential complications. Discussed the procedures and recovery. Patient desires to have surgery. A-scan ordered and performed today for intra-ocular lens calculations. The surgery will be performed in order to improve vision for driving, reading, and for eye examinations. Recommend phacoemulsification with intra-ocular lens. Left Eye. Surgery required to correct imbalance of vision. 3.  Stable. S/P istent inject W OD. Good IOP. OCT rNFL shows thinning OS previously. Stop all glaucoma drops. Patient does report having difficulty remembering drop schedule AND has trouble squeezing the drop bottles. Based on compliance recommend iStent inject both eyes at the time of cataract surgery to improve compliance and help lower eye pressure.

## 2019-06-02 ENCOUNTER — Encounter (HOSPITAL_COMMUNITY)
Admit: 2019-06-02 | Discharge: 2019-06-02 | Disposition: A | Discharge status: Home / Self Care | Payer: PPO | Attending: Ophthalmology | Admitting: Ophthalmology

## 2019-06-02 ENCOUNTER — Other Ambulatory Visit: Payer: Self-pay

## 2019-06-02 ENCOUNTER — Encounter (HOSPITAL_COMMUNITY): Payer: Self-pay

## 2019-06-02 DIAGNOSIS — H25812 Combined forms of age-related cataract, left eye: Secondary | ICD-10-CM | POA: Diagnosis not present

## 2019-06-04 ENCOUNTER — Other Ambulatory Visit: Payer: Self-pay

## 2019-06-04 ENCOUNTER — Other Ambulatory Visit (HOSPITAL_COMMUNITY)
Admission: RE | Admit: 2019-06-04 | Discharge: 2019-06-04 | Disposition: A | Discharge status: Home / Self Care | Payer: PPO | Source: Ambulatory Visit | Attending: Ophthalmology | Admitting: Ophthalmology

## 2019-06-04 ENCOUNTER — Other Ambulatory Visit (HOSPITAL_COMMUNITY): Payer: PPO

## 2019-06-04 DIAGNOSIS — Z20822 Contact with and (suspected) exposure to covid-19: Secondary | ICD-10-CM | POA: Insufficient documentation

## 2019-06-04 DIAGNOSIS — Z01812 Encounter for preprocedural laboratory examination: Secondary | ICD-10-CM | POA: Diagnosis not present

## 2019-06-05 LAB — SARS CORONAVIRUS 2 (TAT 6-24 HRS): SARS Coronavirus 2: NEGATIVE

## 2019-06-06 ENCOUNTER — Ambulatory Visit (HOSPITAL_COMMUNITY): Payer: PPO | Admitting: Certified Registered"

## 2019-06-06 ENCOUNTER — Ambulatory Visit (HOSPITAL_COMMUNITY)
Admission: RE | Admit: 2019-06-06 | Discharge: 2019-06-06 | Disposition: A | Discharge status: Home / Self Care | Payer: PPO | Attending: Ophthalmology | Admitting: Ophthalmology

## 2019-06-06 ENCOUNTER — Encounter (HOSPITAL_COMMUNITY): Payer: Self-pay | Admitting: Ophthalmology

## 2019-06-06 ENCOUNTER — Encounter (HOSPITAL_COMMUNITY)
Admission: RE | Disposition: A | Discharge status: Home / Self Care | Payer: Self-pay | Source: Home / Self Care | Attending: Ophthalmology

## 2019-06-06 DIAGNOSIS — Z79899 Other long term (current) drug therapy: Secondary | ICD-10-CM | POA: Insufficient documentation

## 2019-06-06 DIAGNOSIS — H25812 Combined forms of age-related cataract, left eye: Secondary | ICD-10-CM | POA: Diagnosis not present

## 2019-06-06 DIAGNOSIS — I10 Essential (primary) hypertension: Secondary | ICD-10-CM | POA: Insufficient documentation

## 2019-06-06 DIAGNOSIS — H2181 Floppy iris syndrome: Secondary | ICD-10-CM | POA: Insufficient documentation

## 2019-06-06 DIAGNOSIS — Z9841 Cataract extraction status, right eye: Secondary | ICD-10-CM | POA: Diagnosis not present

## 2019-06-06 DIAGNOSIS — H401431 Capsular glaucoma with pseudoexfoliation of lens, bilateral, mild stage: Secondary | ICD-10-CM | POA: Diagnosis not present

## 2019-06-06 DIAGNOSIS — K219 Gastro-esophageal reflux disease without esophagitis: Secondary | ICD-10-CM | POA: Diagnosis not present

## 2019-06-06 DIAGNOSIS — Z8673 Personal history of transient ischemic attack (TIA), and cerebral infarction without residual deficits: Secondary | ICD-10-CM | POA: Diagnosis not present

## 2019-06-06 DIAGNOSIS — H401421 Capsular glaucoma with pseudoexfoliation of lens, left eye, mild stage: Secondary | ICD-10-CM | POA: Diagnosis not present

## 2019-06-06 HISTORY — PX: CATARACT EXTRACTION W/PHACO: SHX586

## 2019-06-06 HISTORY — PX: INSERTION OF ANTERIOR SEGMENT AQUEOUS DRAINAGE DEVICE (ISTENT): SHX6783

## 2019-06-06 SURGERY — PHACOEMULSIFICATION, CATARACT, WITH IOL INSERTION
Anesthesia: Monitor Anesthesia Care | Site: Eye | Laterality: Left

## 2019-06-06 MED ORDER — EPINEPHRINE PF 1 MG/ML IJ SOLN
INTRAMUSCULAR | Status: AC
  Filled 2019-06-06: qty 2

## 2019-06-06 MED ORDER — TETRACAINE HCL 0.5 % OP SOLN
1.0000 [drp] | OPHTHALMIC | Status: AC | PRN
  Administered 2019-06-06 (×3): 1 [drp] via OPHTHALMIC

## 2019-06-06 MED ORDER — CYCLOPENTOLATE-PHENYLEPHRINE 0.2-1 % OP SOLN
1.0000 [drp] | OPHTHALMIC | Status: AC | PRN
  Administered 2019-06-06 (×3): 1 [drp] via OPHTHALMIC

## 2019-06-06 MED ORDER — PHENYLEPHRINE HCL 2.5 % OP SOLN
1.0000 [drp] | OPHTHALMIC | Status: AC | PRN
  Administered 2019-06-06 (×3): 1 [drp] via OPHTHALMIC

## 2019-06-06 MED ORDER — BSS IO SOLN
INTRAOCULAR | Status: DC | PRN
  Administered 2019-06-06: 15 mL via INTRAOCULAR

## 2019-06-06 MED ORDER — LIDOCAINE HCL (PF) 1 % IJ SOLN
INTRAOCULAR | Status: DC | PRN
  Administered 2019-06-06 (×2): 1 mL via OPHTHALMIC

## 2019-06-06 MED ORDER — PROVISC 10 MG/ML IO SOLN
INTRAOCULAR | Status: DC | PRN
  Administered 2019-06-06: 0.85 mL via INTRAOCULAR

## 2019-06-06 MED ORDER — SODIUM HYALURONATE 23 MG/ML IO SOLN
INTRAOCULAR | Status: DC | PRN
  Administered 2019-06-06: 0.6 mL via INTRAOCULAR

## 2019-06-06 MED ORDER — EPINEPHRINE PF 1 MG/ML IJ SOLN
INTRAOCULAR | Status: DC | PRN
  Administered 2019-06-06: 500 mL

## 2019-06-06 MED ORDER — NEOMYCIN-POLYMYXIN-DEXAMETH 3.5-10000-0.1 OP SUSP
OPHTHALMIC | Status: DC | PRN
  Administered 2019-06-06: 1 [drp] via OPHTHALMIC

## 2019-06-06 MED ORDER — LIDOCAINE HCL 3.5 % OP GEL
1.0000 "application " | Freq: Once | OPHTHALMIC | Status: AC
  Administered 2019-06-06: 1 via OPHTHALMIC

## 2019-06-06 MED ORDER — POVIDONE-IODINE 5 % OP SOLN
OPHTHALMIC | Status: DC | PRN
  Administered 2019-06-06: 1 via OPHTHALMIC

## 2019-06-06 SURGICAL SUPPLY — 15 items
CLOTH BEACON ORANGE TIMEOUT ST (SAFETY) ×3 IMPLANT
DEVICE INJECT ISTENT W (Stent) ×1 IMPLANT
EYE SHIELD UNIVERSAL CLEAR (GAUZE/BANDAGES/DRESSINGS) ×3 IMPLANT
GLOVE BIOGEL PI IND STRL 7.0 (GLOVE) ×2 IMPLANT
GLOVE BIOGEL PI INDICATOR 7.0 (GLOVE) ×4
INJECT ISTENT W (Stent) ×3 IMPLANT
LENS ALC ACRYL/TECN (Ophthalmic Related) ×3 IMPLANT
NEEDLE HYPO 18GX1.5 BLUNT FILL (NEEDLE) ×3 IMPLANT
PAD ARMBOARD 7.5X6 YLW CONV (MISCELLANEOUS) ×3 IMPLANT
RING MALYGIN 7.0 (MISCELLANEOUS) ×3 IMPLANT
SYR TB 1ML LL NO SAFETY (SYRINGE) ×3 IMPLANT
TAPE SURG TRANSPORE 1 IN (GAUZE/BANDAGES/DRESSINGS) ×1 IMPLANT
TAPE SURGICAL TRANSPORE 1 IN (GAUZE/BANDAGES/DRESSINGS) ×3
VISCOELASTIC ADDITIONAL (OPHTHALMIC RELATED) ×3 IMPLANT
WATER STERILE IRR 250ML POUR (IV SOLUTION) ×3 IMPLANT

## 2019-06-06 NOTE — Anesthesia Preprocedure Evaluation (Signed)
Anesthesia Evaluation  Patient identified by MRN, date of birth, ID band Patient awake    Reviewed: Allergy & Precautions, H&P , NPO status , Patient's Chart, lab work & pertinent test results, reviewed documented beta blocker date and time   Airway Mallampati: II  TM Distance: >3 FB Neck ROM: full    Dental no notable dental hx.    Pulmonary pneumonia, resolved,    Pulmonary exam normal breath sounds clear to auscultation       Cardiovascular Exercise Tolerance: Good hypertension, negative cardio ROS   Rhythm:regular Rate:Normal     Neuro/Psych TIAnegative psych ROS   GI/Hepatic negative GI ROS, Neg liver ROS,   Endo/Other  negative endocrine ROS  Renal/GU negative Renal ROS  negative genitourinary   Musculoskeletal   Abdominal   Peds  Hematology negative hematology ROS (+)   Anesthesia Other Findings   Reproductive/Obstetrics negative OB ROS                             Anesthesia Physical Anesthesia Plan  ASA: III  Anesthesia Plan: MAC   Post-op Pain Management:    Induction:   PONV Risk Score and Plan:   Airway Management Planned:   Additional Equipment:   Intra-op Plan:   Post-operative Plan:   Informed Consent: I have reviewed the patients History and Physical, chart, labs and discussed the procedure including the risks, benefits and alternatives for the proposed anesthesia with the patient or authorized representative who has indicated his/her understanding and acceptance.     Dental Advisory Given  Plan Discussed with: CRNA  Anesthesia Plan Comments:         Anesthesia Quick Evaluation

## 2019-06-06 NOTE — Anesthesia Postprocedure Evaluation (Signed)
Anesthesia Post Note  Patient: Tracey Walsh  Procedure(s) Performed: CATARACT EXTRACTION PHACO AND INTRAOCULAR LENS PLACEMENT LEFT EYE (Left Eye) INSERTION OF ANTERIOR SEGMENT AQUEOUS DRAINAGE DEVICE (ISTENT) LEFT EYE (Left Eye)  Patient location during evaluation: Phase II Anesthesia Type: MAC Level of consciousness: awake, oriented and awake and alert Pain management: pain level controlled Vital Signs Assessment: vitals unstable and post-procedure vital signs reviewed and stable Respiratory status: spontaneous breathing and nonlabored ventilation Cardiovascular status: blood pressure returned to baseline and stable Postop Assessment: no headache and no backache Anesthetic complications: no     Last Vitals:  Vitals:   06/06/19 1019  BP: (!) 160/89  Pulse: 95  Resp: 20  Temp: (!) 36.4 C  SpO2: 97%    Last Pain:  Vitals:   06/06/19 1019  TempSrc: Oral  PainSc: 5                  Tacy Learn

## 2019-06-06 NOTE — Interval H&P Note (Signed)
History and Physical Interval Note: The H and P was reviewed and updated. The patient was examined.  No changes were found after exam.  The surgical eye was marked.  06/06/2019 10:57 AM  Tracey Walsh  has presented today for surgery, with the diagnosis of Nuclear sclerotic cataract - Left eye Glaucoma - Left eye.  The various methods of treatment have been discussed with the patient and family. After consideration of risks, benefits and other options for treatment, the patient has consented to  Procedure(s) with comments: CATARACT EXTRACTION PHACO AND INTRAOCULAR LENS PLACEMENT LEFT EYE (Left) - CDE: INSERTION OF ANTERIOR SEGMENT AQUEOUS DRAINAGE DEVICE (ISTENT) LEFT EYE (Left) as a surgical intervention.  The patient's history has been reviewed, patient examined, no change in status, stable for surgery.  I have reviewed the patient's chart and labs.  Questions were answered to the patient's satisfaction.     [name]

## 2019-06-06 NOTE — Discharge Instructions (Addendum)
Please discharge patient when stable, will follow up today with Dr. Azuri Bozard at the [facility] office immediately following discharge.  Leave shield in place until visit.  All paperwork with discharge instructions will be given at the office.  [facility] Address:  [address]  [place], [place] [address]   Cataract Surgery, Care After This sheet gives you information about how to care for yourself after your procedure. Your health care provider may also give you more specific instructions. If you have problems or questions, contact your health care provider. What can I expect after the procedure? After the procedure, it is common to have:  Itching.  Discomfort.  Fluid discharge.  Sensitivity to light and to touch.  Bruising in or around the eye.  Mild blurred vision. Follow these instructions at home: Eye care   Do not touch or rub your eyes.  Protect your eyes as told by your health care provider. You may be told to wear a protective eye shield or sunglasses.  Do not put a contact lens into the affected eye or eyes until your health care provider approves.  Keep the area around your eye clean and dry: ? Avoid swimming. ? Do not allow water to hit you directly in the face while showering. ? Keep soap and shampoo out of your eyes.  Check your eye every day for signs of infection. Watch for: ? Redness, swelling, or pain. ? Fluid, blood, or pus. ? Warmth. ? A bad smell. ? Vision that is getting worse. ? Sensitivity that is getting worse. Activity  Do not drive for 24 hours if you were given a sedative during your procedure.  Avoid strenuous activities, such as playing contact sports, for as long as told by your health care provider.  Do not drive or use heavy machinery until your health care provider approves.  Do not bend or lift heavy objects. Bending increases pressure in the eye. You can walk, climb stairs, and do light  household chores.  Ask your health care provider when you can return to work. If you work in a dusty environment, you may be advised to wear protective eyewear for a period of time. General instructions  Take or apply over-the-counter and prescription medicines only as told by your health care provider. This includes eye drops.  Keep all follow-up visits as told by your health care provider. This is important. Contact a health care provider if:  You have increased bruising around your eye.  You have pain that is not helped with medicine.  You have a fever.  You have redness, swelling, or pain in your eye.  You have fluid, blood, or pus coming from your incision.  Your vision gets worse.  Your sensitivity to light gets worse. Get help right away if:  You have sudden loss of vision.  You see flashes of light or spots (floaters).  You have severe eye pain.  You develop nausea or vomiting. Summary  After your procedure, it is common to have itching, discomfort, bruising, fluid discharge, or sensitivity to light.  Follow instructions from your health care provider about caring for your eye after the procedure.  Do not rub your eye after the procedure. You may need to wear eye protection or sunglasses. Do not wear contact lenses. Keep the area around your eye clean and dry.  Avoid activities that require a lot of effort. These include playing sports and lifting heavy objects.  Contact a health care   provider if you have increased bruising, pain that does not go away, or a fever. Get help right away if you suddenly lose your vision, see flashes of light or spots, or have severe pain in the eye. This information is not intended to replace advice given to you by your health care provider. Make sure you discuss any questions you have with your health care provider. Document Revised: 10/29/2018 Document Reviewed: 07/02/2017 Elsevier Patient Education  2020 [employer] Monitored  Anesthesia Care, Care After These instructions provide you with information about caring for yourself after your procedure. Your health care provider may also give you more specific instructions. Your treatment has been planned according to current medical practices, but problems sometimes occur. Call your health care provider if you have any problems or questions after your procedure. What can I expect after the procedure? After your procedure, you may:  Feel sleepy for several hours.  Feel clumsy and have poor balance for several hours.  Feel forgetful about what happened after the procedure.  Have poor judgment for several hours.  Feel nauseous or vomit.  Have a sore throat if you had a breathing tube during the procedure. Follow these instructions at home: For at least 24 hours after the procedure:      Have a responsible adult stay with you. It is important to have someone help care for you until you are awake and alert.  Rest as needed.  Do not: ? Participate in activities in which you could fall or become injured. ? Drive. ? Use heavy machinery. ? Drink alcohol. ? Take sleeping pills or medicines that cause drowsiness. ? Make important decisions or sign legal documents. ? Take care of children on your own. Eating and drinking  Follow the diet that is recommended by your health care provider.  If you vomit, drink water, juice, or soup when you can drink without vomiting.  Make sure you have little or no nausea before eating solid foods. General instructions  Take over-the-counter and prescription medicines only as told by your health care provider.  If you have sleep apnea, surgery and certain medicines can increase your risk for breathing problems. Follow instructions from your health care provider about wearing your sleep device: ? Anytime you are sleeping, including during daytime naps. ? While taking prescription pain medicines, sleeping medicines, or  medicines that make you drowsy.  If you smoke, do not smoke without supervision.  Keep all follow-up visits as told by your health care provider. This is important. Contact a health care provider if:  You keep feeling nauseous or you keep vomiting.  You feel light-headed.  You develop a rash.  You have a fever. Get help right away if:  You have trouble breathing. Summary  For several hours after your procedure, you may feel sleepy and have poor judgment.  Have a responsible adult stay with you for at least 24 hours or until you are awake and alert. This information is not intended to replace advice given to you by your health care provider. Make sure you discuss any questions you have with your health care provider. Document Revised: 04/02/2017 Document Reviewed: 04/25/2015 Elsevier Patient Education  2020 [employer]  

## 2019-06-06 NOTE — Op Note (Signed)
Date of procedure: 06/06/19  Pre-operative diagnosis: Visually significant combined-form age-related cataract, Left Eye; Poor dilation, Left eye (H25.812; H21.81); Pseudoexfoliative glaucoma, mild, left eye  Post-operative diagnosis: Visually significant cataract, Left Eye; Intra-operative Floppy Iris Syndrome, Left Eye; Pseudoexfoliative glaucoma, mild, left eye  Procedure:  1. Complex removal of cataract via phacoemulsification and insertion of intra-ocular lens [place]  +25.0D into the capsular bag of the Left Eye (CPT [phone]) 2. Placement of iStent Inject W into the trabecular meshwork of the left eye (1115Z, [id number])  Attending surgeon: Gerda Diss. Beverly Ferner, MD, MA  Anesthesia: MAC, Topical Akten  Complications: None  Estimated Blood Loss: <63m (minimal)  Specimens: None  Implants: As above  Indications:  Visually significant cataract, Left Eye; Pseudoexfoliative glaucoma, mild, left eye  Procedure:  The patient was seen and identified in the pre-operative area. The operative eye was identified and dilated.  The operative eye was marked.  Topical anesthesia was administered to the operative eye.     The patient was then to the operative suite and placed in the supine position.  A timeout was performed confirming the patient, procedure to be performed, and all other relevant information.   The patient's face was prepped and draped in the usual fashion for intra-ocular surgery.  A lid speculum was placed into the operative eye and the surgical microscope moved into place and focused.  Poor dilation of the iris was confirmed.  An inferotemporal paracentesis was created using a 20 gauge paracentesis blade.  Shugarcaine was injected into the anterior chamber.  Viscoelastic was injected into the anterior chamber.  A temporal clear-corneal main wound incision was created using a 2.413mmicrokeratome.  A Malyugin ring was placed.  A continuous curvilinear capsulorrhexis was  initiated using an irrigating cystitome and completed using capsulorrhexis forceps.  Hydrodissection and hydrodeliniation were performed.  Viscoelastic was injected into the anterior chamber.  A phacoemulsification handpiece and a chopper as a second instrument were used to remove the nucleus and epinucleus. The irrigation/aspiration handpiece was used to remove any remaining cortical material.   The capsular bag was reinflated with viscoelastic, checked, and found to be intact.  The intraocular lens was inserted into the capsular bag and dialed into place using a Kuglen hook.   The patient's head was repositioned.  iStent inject W (2 stents) were placed 2 clock hours apart under gonioscopy in the nasal trabecular meshwork without complication.    The Malyugin ring was removed.  The irrigation/aspiration handpiece was used to remove any remaining viscoelastic.  The clear corneal wound and paracentesis wounds were then hydrated and checked with Weck-Cels to be watertight.  The lid-speculum and drape was removed, and the patient's face was cleaned with a wet and dry 4x4.  Maxitrol was instilled in the eye before a clear shield was taped over the eye. The patient was taken to the post-operative care unit in good condition, having tolerated the procedure well.  Post-Op Instructions: The patient will follow [facility] at [facility] a same day post-operative evaluation and will receive all other orders and instructions.

## 2019-06-06 NOTE — Transfer of Care (Signed)
Immediate Anesthesia Transfer of Care Note  Patient: Tracey Walsh  Procedure(s) Performed: CATARACT EXTRACTION PHACO AND INTRAOCULAR LENS PLACEMENT LEFT EYE (Left Eye) INSERTION OF ANTERIOR SEGMENT AQUEOUS DRAINAGE DEVICE (ISTENT) LEFT EYE (Left Eye)  Patient Location: PACU and Short Stay  Anesthesia Type:MAC  Level of Consciousness: awake, alert  and oriented  Airway & Oxygen Therapy: Patient Spontanous Breathing  Post-op Assessment: Report given to RN, Post -op Vital signs reviewed and stable and Patient moving all extremities X 4  Post vital signs: Reviewed and stable  Last Vitals:  Vitals Value Taken Time  BP    Temp    Pulse    Resp    SpO2      Last Pain:  Vitals:   06/06/19 1019  TempSrc: Oral  PainSc: 5       Patients Stated Pain Goal: 8 ([id number])  Complications: No apparent anesthesia complications

## 2019-08-12 ENCOUNTER — Telehealth: Payer: Self-pay | Admitting: Urology

## 2019-08-12 NOTE — Telephone Encounter (Signed)
Mary called to cancel pt appt. She has moved and will seeing another urologist. Would like a call back to confirm cancellation.

## 2019-09-12 ENCOUNTER — Ambulatory Visit: Payer: PPO | Admitting: Urology
